# Patient Record
Sex: Female | Born: 1955 | Race: Black or African American | Hispanic: No | Marital: Married | Smoking: Never smoker
Health system: Southern US, Community
[De-identification: ages and names within clinical notes are randomized; demographics above are authoritative.]

## PROBLEM LIST (undated history)

## (undated) DIAGNOSIS — R053 Chronic cough: Secondary | ICD-10-CM

## (undated) DIAGNOSIS — I219 Acute myocardial infarction, unspecified: Secondary | ICD-10-CM

## (undated) DIAGNOSIS — E785 Hyperlipidemia, unspecified: Secondary | ICD-10-CM

## (undated) DIAGNOSIS — R05 Cough: Secondary | ICD-10-CM

## (undated) DIAGNOSIS — K219 Gastro-esophageal reflux disease without esophagitis: Secondary | ICD-10-CM

## (undated) HISTORY — DX: Cough: R05

## (undated) HISTORY — PX: TUBAL LIGATION: SHX77

## (undated) HISTORY — DX: Acute myocardial infarction, unspecified: I21.9

## (undated) HISTORY — DX: Hyperlipidemia, unspecified: E78.5

## (undated) HISTORY — PX: ABDOMINAL HYSTERECTOMY: SHX81

## (undated) HISTORY — DX: Chronic cough: R05.3

## (undated) HISTORY — DX: Gastro-esophageal reflux disease without esophagitis: K21.9

---

## 1982-08-15 HISTORY — PX: BREAST SURGERY: SHX581

## 2003-05-28 ENCOUNTER — Encounter: Payer: Self-pay | Admitting: Emergency Medicine

## 2003-05-28 ENCOUNTER — Emergency Department (HOSPITAL_COMMUNITY): Admission: EM | Admit: 2003-05-28 | Discharge: 2003-05-28 | Payer: Self-pay | Admitting: Emergency Medicine

## 2004-09-30 ENCOUNTER — Ambulatory Visit: Payer: Self-pay | Admitting: Family Medicine

## 2004-11-05 ENCOUNTER — Ambulatory Visit: Payer: Self-pay | Admitting: Family Medicine

## 2004-11-05 ENCOUNTER — Other Ambulatory Visit: Admission: RE | Admit: 2004-11-05 | Discharge: 2004-11-05 | Payer: Self-pay | Admitting: Family Medicine

## 2004-11-10 ENCOUNTER — Encounter: Admission: RE | Admit: 2004-11-10 | Discharge: 2004-11-10 | Payer: Self-pay | Admitting: Family Medicine

## 2004-11-19 ENCOUNTER — Ambulatory Visit: Payer: Self-pay

## 2005-04-05 ENCOUNTER — Ambulatory Visit: Payer: Self-pay | Admitting: Family Medicine

## 2005-05-19 ENCOUNTER — Ambulatory Visit: Payer: Self-pay | Admitting: Family Medicine

## 2005-06-03 ENCOUNTER — Ambulatory Visit: Payer: Self-pay | Admitting: Internal Medicine

## 2005-06-07 ENCOUNTER — Ambulatory Visit: Payer: Self-pay | Admitting: Family Medicine

## 2006-02-10 ENCOUNTER — Ambulatory Visit: Payer: Self-pay | Admitting: Family Medicine

## 2006-02-24 ENCOUNTER — Ambulatory Visit (HOSPITAL_COMMUNITY): Admission: RE | Admit: 2006-02-24 | Discharge: 2006-02-24 | Payer: Self-pay | Admitting: Family Medicine

## 2006-03-07 ENCOUNTER — Encounter: Admission: RE | Admit: 2006-03-07 | Discharge: 2006-03-07 | Payer: Self-pay | Admitting: Family Medicine

## 2006-06-05 ENCOUNTER — Ambulatory Visit: Payer: Self-pay | Admitting: Family Medicine

## 2006-10-09 ENCOUNTER — Ambulatory Visit: Payer: Self-pay | Admitting: Family Medicine

## 2006-10-27 ENCOUNTER — Encounter: Admission: RE | Admit: 2006-10-27 | Discharge: 2006-10-27 | Payer: Self-pay | Admitting: Internal Medicine

## 2006-10-27 ENCOUNTER — Ambulatory Visit: Payer: Self-pay | Admitting: Internal Medicine

## 2007-07-05 ENCOUNTER — Ambulatory Visit: Payer: Self-pay | Admitting: Family Medicine

## 2007-11-01 ENCOUNTER — Ambulatory Visit: Payer: Self-pay | Admitting: Family Medicine

## 2007-11-01 DIAGNOSIS — R252 Cramp and spasm: Secondary | ICD-10-CM

## 2007-11-01 DIAGNOSIS — M25559 Pain in unspecified hip: Secondary | ICD-10-CM | POA: Insufficient documentation

## 2007-11-01 DIAGNOSIS — E785 Hyperlipidemia, unspecified: Secondary | ICD-10-CM | POA: Insufficient documentation

## 2007-11-01 DIAGNOSIS — K219 Gastro-esophageal reflux disease without esophagitis: Secondary | ICD-10-CM

## 2007-12-11 ENCOUNTER — Telehealth (INDEPENDENT_AMBULATORY_CARE_PROVIDER_SITE_OTHER): Payer: Self-pay | Admitting: *Deleted

## 2008-02-22 ENCOUNTER — Ambulatory Visit: Payer: Self-pay | Admitting: Family Medicine

## 2008-02-22 ENCOUNTER — Encounter (INDEPENDENT_AMBULATORY_CARE_PROVIDER_SITE_OTHER): Payer: Self-pay | Admitting: *Deleted

## 2008-02-22 DIAGNOSIS — I252 Old myocardial infarction: Secondary | ICD-10-CM

## 2008-02-22 DIAGNOSIS — D239 Other benign neoplasm of skin, unspecified: Secondary | ICD-10-CM | POA: Insufficient documentation

## 2008-02-22 DIAGNOSIS — E8941 Symptomatic postprocedural ovarian failure: Secondary | ICD-10-CM

## 2008-02-25 ENCOUNTER — Telehealth (INDEPENDENT_AMBULATORY_CARE_PROVIDER_SITE_OTHER): Payer: Self-pay | Admitting: *Deleted

## 2008-03-06 ENCOUNTER — Encounter: Admission: RE | Admit: 2008-03-06 | Discharge: 2008-03-06 | Payer: Self-pay | Admitting: Family Medicine

## 2008-03-06 ENCOUNTER — Encounter: Payer: Self-pay | Admitting: Family Medicine

## 2008-03-06 ENCOUNTER — Encounter (INDEPENDENT_AMBULATORY_CARE_PROVIDER_SITE_OTHER): Payer: Self-pay | Admitting: *Deleted

## 2008-03-07 ENCOUNTER — Encounter: Payer: Self-pay | Admitting: Family Medicine

## 2008-03-07 ENCOUNTER — Ambulatory Visit: Payer: Self-pay | Admitting: Gastroenterology

## 2008-03-07 ENCOUNTER — Ambulatory Visit: Payer: Self-pay

## 2008-03-10 ENCOUNTER — Encounter (INDEPENDENT_AMBULATORY_CARE_PROVIDER_SITE_OTHER): Payer: Self-pay | Admitting: *Deleted

## 2008-03-14 ENCOUNTER — Encounter: Payer: Self-pay | Admitting: Gastroenterology

## 2008-03-14 ENCOUNTER — Ambulatory Visit: Payer: Self-pay | Admitting: Gastroenterology

## 2008-03-17 ENCOUNTER — Encounter (INDEPENDENT_AMBULATORY_CARE_PROVIDER_SITE_OTHER): Payer: Self-pay | Admitting: *Deleted

## 2008-03-17 ENCOUNTER — Encounter: Payer: Self-pay | Admitting: Gastroenterology

## 2008-05-20 ENCOUNTER — Encounter: Admission: RE | Admit: 2008-05-20 | Discharge: 2008-05-20 | Payer: Self-pay | Admitting: Family Medicine

## 2008-05-22 ENCOUNTER — Ambulatory Visit: Payer: Self-pay | Admitting: Family Medicine

## 2008-05-28 ENCOUNTER — Encounter: Payer: Self-pay | Admitting: Family Medicine

## 2008-05-30 ENCOUNTER — Telehealth (INDEPENDENT_AMBULATORY_CARE_PROVIDER_SITE_OTHER): Payer: Self-pay | Admitting: *Deleted

## 2008-06-26 ENCOUNTER — Encounter (INDEPENDENT_AMBULATORY_CARE_PROVIDER_SITE_OTHER): Payer: Self-pay | Admitting: Diagnostic Radiology

## 2008-06-26 ENCOUNTER — Encounter: Admission: RE | Admit: 2008-06-26 | Discharge: 2008-06-26 | Payer: Self-pay | Admitting: Family Medicine

## 2008-06-26 ENCOUNTER — Encounter: Payer: Self-pay | Admitting: Family Medicine

## 2009-07-07 ENCOUNTER — Ambulatory Visit: Payer: Self-pay | Admitting: Family Medicine

## 2009-07-07 DIAGNOSIS — R0989 Other specified symptoms and signs involving the circulatory and respiratory systems: Secondary | ICD-10-CM

## 2009-07-07 DIAGNOSIS — R42 Dizziness and giddiness: Secondary | ICD-10-CM

## 2009-07-08 ENCOUNTER — Ambulatory Visit: Payer: Self-pay | Admitting: Family Medicine

## 2009-07-11 LAB — CONVERTED CEMR LAB
Basophils Relative: 0.9 % (ref 0.0–3.0)
Bilirubin, Direct: 0.1 mg/dL (ref 0.0–0.3)
CO2: 30 meq/L (ref 19–32)
Chloride: 106 meq/L (ref 96–112)
Eosinophils Absolute: 0.1 10*3/uL (ref 0.0–0.7)
Eosinophils Relative: 1.1 % (ref 0.0–5.0)
HCT: 35.3 % — ABNORMAL LOW (ref 36.0–46.0)
Lymphocytes Relative: 36 % (ref 12.0–46.0)
Microalb Creat Ratio: 3.8 mg/g (ref 0.0–30.0)
Monocytes Absolute: 0.3 10*3/uL (ref 0.1–1.0)
Monocytes Relative: 7.1 % (ref 3.0–12.0)
Potassium: 3.8 meq/L (ref 3.5–5.1)
RBC: 3.89 M/uL (ref 3.87–5.11)
RDW: 13.7 % (ref 11.5–14.6)
Sodium: 142 meq/L (ref 135–145)
Total Bilirubin: 0.8 mg/dL (ref 0.3–1.2)
Total Protein: 7.5 g/dL (ref 6.0–8.3)
Triglycerides: 82 mg/dL (ref 0.0–149.0)
WBC: 4.6 10*3/uL (ref 4.5–10.5)

## 2009-07-15 ENCOUNTER — Telehealth (INDEPENDENT_AMBULATORY_CARE_PROVIDER_SITE_OTHER): Payer: Self-pay | Admitting: *Deleted

## 2009-07-20 ENCOUNTER — Telehealth (INDEPENDENT_AMBULATORY_CARE_PROVIDER_SITE_OTHER): Payer: Self-pay | Admitting: *Deleted

## 2009-07-23 ENCOUNTER — Encounter: Payer: Self-pay | Admitting: Family Medicine

## 2009-07-24 ENCOUNTER — Ambulatory Visit: Payer: Self-pay

## 2009-07-24 ENCOUNTER — Encounter: Payer: Self-pay | Admitting: Family Medicine

## 2009-07-27 ENCOUNTER — Encounter (INDEPENDENT_AMBULATORY_CARE_PROVIDER_SITE_OTHER): Payer: Self-pay | Admitting: *Deleted

## 2009-07-28 ENCOUNTER — Telehealth: Payer: Self-pay | Admitting: Family Medicine

## 2009-07-31 ENCOUNTER — Ambulatory Visit: Payer: Self-pay | Admitting: Family Medicine

## 2009-07-31 DIAGNOSIS — M25519 Pain in unspecified shoulder: Secondary | ICD-10-CM

## 2009-07-31 DIAGNOSIS — M79609 Pain in unspecified limb: Secondary | ICD-10-CM

## 2009-08-03 ENCOUNTER — Ambulatory Visit: Payer: Self-pay | Admitting: Family Medicine

## 2009-08-04 ENCOUNTER — Telehealth (INDEPENDENT_AMBULATORY_CARE_PROVIDER_SITE_OTHER): Payer: Self-pay | Admitting: *Deleted

## 2010-03-22 ENCOUNTER — Telehealth: Payer: Self-pay | Admitting: Family Medicine

## 2010-03-23 ENCOUNTER — Ambulatory Visit: Payer: Self-pay | Admitting: Diagnostic Radiology

## 2010-03-23 ENCOUNTER — Ambulatory Visit: Payer: Self-pay | Admitting: Family Medicine

## 2010-03-23 ENCOUNTER — Ambulatory Visit (HOSPITAL_BASED_OUTPATIENT_CLINIC_OR_DEPARTMENT_OTHER): Admission: RE | Admit: 2010-03-23 | Discharge: 2010-03-23 | Payer: Self-pay | Admitting: Family Medicine

## 2010-03-23 DIAGNOSIS — IMO0002 Reserved for concepts with insufficient information to code with codable children: Secondary | ICD-10-CM

## 2010-03-23 DIAGNOSIS — M549 Dorsalgia, unspecified: Secondary | ICD-10-CM | POA: Insufficient documentation

## 2010-05-19 ENCOUNTER — Emergency Department (HOSPITAL_BASED_OUTPATIENT_CLINIC_OR_DEPARTMENT_OTHER)
Admission: EM | Admit: 2010-05-19 | Discharge: 2010-05-19 | Payer: Self-pay | Source: Home / Self Care | Admitting: Emergency Medicine

## 2010-05-19 ENCOUNTER — Encounter: Payer: Self-pay | Admitting: Family Medicine

## 2010-05-19 ENCOUNTER — Telehealth: Payer: Self-pay | Admitting: Family Medicine

## 2010-05-19 ENCOUNTER — Ambulatory Visit: Payer: Self-pay | Admitting: Diagnostic Radiology

## 2010-05-23 ENCOUNTER — Emergency Department (HOSPITAL_BASED_OUTPATIENT_CLINIC_OR_DEPARTMENT_OTHER): Admission: EM | Admit: 2010-05-23 | Discharge: 2010-05-23 | Payer: Self-pay | Admitting: Emergency Medicine

## 2010-05-27 ENCOUNTER — Ambulatory Visit: Payer: Self-pay | Admitting: Family Medicine

## 2010-05-31 ENCOUNTER — Encounter (INDEPENDENT_AMBULATORY_CARE_PROVIDER_SITE_OTHER): Payer: Self-pay | Admitting: *Deleted

## 2010-06-01 ENCOUNTER — Telehealth: Payer: Self-pay | Admitting: Family Medicine

## 2010-06-01 ENCOUNTER — Encounter: Payer: Self-pay | Admitting: Family Medicine

## 2010-06-07 ENCOUNTER — Encounter: Payer: Self-pay | Admitting: Family Medicine

## 2010-06-07 ENCOUNTER — Telehealth: Payer: Self-pay | Admitting: Family Medicine

## 2010-07-01 ENCOUNTER — Encounter: Payer: Self-pay | Admitting: Family Medicine

## 2010-07-06 ENCOUNTER — Encounter: Payer: Self-pay | Admitting: Family Medicine

## 2010-07-06 ENCOUNTER — Ambulatory Visit: Payer: Self-pay | Admitting: Family Medicine

## 2010-07-06 LAB — CONVERTED CEMR LAB
Bilirubin Urine: NEGATIVE
Glucose, Urine, Semiquant: NEGATIVE
Ketones, urine, test strip: NEGATIVE
Urobilinogen, UA: NEGATIVE
pH: 7

## 2010-07-07 ENCOUNTER — Encounter: Payer: Self-pay | Admitting: Family Medicine

## 2010-07-12 ENCOUNTER — Telehealth (INDEPENDENT_AMBULATORY_CARE_PROVIDER_SITE_OTHER): Payer: Self-pay | Admitting: *Deleted

## 2010-07-12 LAB — CONVERTED CEMR LAB
Albumin: 3.7 g/dL (ref 3.5–5.2)
Basophils Absolute: 0 10*3/uL (ref 0.0–0.1)
Basophils Relative: 0.6 % (ref 0.0–3.0)
CO2: 26 meq/L (ref 19–32)
Chloride: 107 meq/L (ref 96–112)
Cholesterol: 211 mg/dL — ABNORMAL HIGH (ref 0–200)
Creatinine, Ser: 0.9 mg/dL (ref 0.4–1.2)
GFR calc non Af Amer: 83.42 mL/min (ref >60)
HDL: 59.5 mg/dL (ref >39.00)
Hemoglobin: 11.1 g/dL — ABNORMAL LOW (ref 12.0–15.0)
Lymphs Abs: 1.5 10*3/uL (ref 0.7–4.0)
MCV: 89.7 fL (ref 78.0–100.0)
Monocytes Relative: 6.2 % (ref 3.0–12.0)
Neutro Abs: 3.1 10*3/uL (ref 1.4–7.7)
Platelets: 393 10*3/uL (ref 150.0–400.0)
RBC: 3.71 M/uL — ABNORMAL LOW (ref 3.87–5.11)
RDW: 15.6 % — ABNORMAL HIGH (ref 11.5–14.6)
Sodium: 145 meq/L (ref 135–145)
TSH: 0.42 microintl units/mL (ref 0.35–5.50)
Triglycerides: 53 mg/dL (ref 0.0–149.0)
VLDL: 10.6 mg/dL (ref 0.0–40.0)

## 2010-07-15 ENCOUNTER — Encounter
Admission: RE | Admit: 2010-07-15 | Discharge: 2010-08-10 | Payer: Self-pay | Source: Home / Self Care | Attending: Physical Medicine and Rehabilitation | Admitting: Physical Medicine and Rehabilitation

## 2010-07-22 ENCOUNTER — Emergency Department (HOSPITAL_BASED_OUTPATIENT_CLINIC_OR_DEPARTMENT_OTHER): Admission: EM | Admit: 2010-07-22 | Discharge: 2010-06-04 | Payer: Self-pay | Admitting: Emergency Medicine

## 2010-09-01 ENCOUNTER — Ambulatory Visit
Admission: RE | Admit: 2010-09-01 | Discharge: 2010-09-01 | Payer: Self-pay | Source: Home / Self Care | Attending: Family Medicine | Admitting: Family Medicine

## 2010-09-01 DIAGNOSIS — J019 Acute sinusitis, unspecified: Secondary | ICD-10-CM | POA: Insufficient documentation

## 2010-09-04 ENCOUNTER — Encounter: Payer: Self-pay | Admitting: Family Medicine

## 2010-09-05 ENCOUNTER — Encounter: Payer: Self-pay | Admitting: Family Medicine

## 2010-09-10 ENCOUNTER — Encounter
Admission: RE | Admit: 2010-09-10 | Discharge: 2010-09-10 | Payer: Self-pay | Source: Home / Self Care | Attending: Family Medicine | Admitting: Family Medicine

## 2010-09-12 LAB — CONVERTED CEMR LAB
ALT: 13 units/L (ref 0–35)
Albumin: 3.9 g/dL (ref 3.5–5.2)
Bilirubin Urine: NEGATIVE
Bilirubin, Direct: 0.1 mg/dL (ref 0.0–0.3)
Blood in Urine, dipstick: NEGATIVE
CO2: 32 meq/L (ref 19–32)
Calcium: 9.7 mg/dL (ref 8.4–10.5)
Cholesterol: 196 mg/dL (ref 0–200)
Glucose, Urine, Semiquant: NEGATIVE
Glucose, Urine, Semiquant: NEGATIVE
HCT: 35.7 % — ABNORMAL LOW (ref 36.0–46.0)
HDL: 56.5 mg/dL (ref >39.0)
Hemoglobin: 10.8 g/dL
Hemoglobin: 12.1 g/dL (ref 12.0–15.0)
Ketones, urine, test strip: NEGATIVE
LDL Cholesterol: 125 mg/dL — ABNORMAL HIGH (ref 0–99)
Monocytes Absolute: 0.3 10*3/uL (ref 0.1–1.0)
Neutrophils Relative %: 55.8 % (ref 43.0–77.0)
Nitrite: NEGATIVE
Platelets: 378 10*3/uL (ref 150–400)
Protein, U semiquant: NEGATIVE
Protein, U semiquant: NEGATIVE
Specific Gravity, Urine: 1.015
Specific Gravity, Urine: 1.015
TSH: 0.57 microintl units/mL (ref 0.35–5.50)
VLDL: 14 mg/dL (ref 0–40)
WBC Urine, dipstick: NEGATIVE
pH: 5
pH: 6.5

## 2010-09-14 NOTE — Progress Notes (Signed)
Summary: Pt wants and extended note  Phone Note Call from Patient   Caller: Patient Call For: Loreen Freud DO Details for Reason: Still home Summary of Call: Call from pt says she still in bed and having pain, sees Ortho next wednesday Nov 2nd. Wants to get another letter extending through next Friday Nov 5th. We wrote her back in to work on the 21st.  Please advise. c/b [id number]..... Initial call taken by: Almeta Monas CMA Duncan Dull),  June 07, 2010 1:12 PM  Follow-up for Phone Call        WE CAN WRITE IT UNTIL NEXT wEDNESDAY---ORTHO WILL EXTEND IT PAST THAT IF THEY WANT HER TO STAY OUT Follow-up by: Loreen Freud DO,  June 07, 2010 2:31 PM  Additional Follow-up for Phone Call Additional follow up Details #1::        pt notified and voiced understanading.... Almeta Monas CMA Duncan Dull)  June 07, 2010 3:20 PM

## 2010-09-14 NOTE — Letter (Signed)
Summary: Out of Work  [facility]  [address] [place], [place] [address]   Phone: [phone]  Fax: [phone]       June 07, 2010   Employee:  W. MAE Boesen    To Whom It May Concern:   For Medical reasons, please excuse the above named employee from work for the following dates:  Start:   06/07/10  End:   06/16/10  If you need additional information, please feel free to contact our office.         Sincerely,        Loreen Freud DO

## 2010-09-14 NOTE — Progress Notes (Signed)
Summary: Casey Carrillo pt to ED  Phone Note Call from Patient   Caller: Patient Summary of Call: Pt called c/o severe sharp pain in right side. Pt states that it is very tender to touch but unable to confirm if there is any swelling or redness due to unable to see area. Pt advise ED, pt ok .................Marland KitchenFelecia Deloach CMA  May 19, 2010 11:01 AM

## 2010-09-14 NOTE — Progress Notes (Signed)
Summary: Back Spasms- fyi  Phone Note Call from Patient Call back at [phone]   Caller: Patient Call For: Loreen Freud DO Summary of Call: Patient is having back spasms and wants to talk to the nurse about it. Initial call taken by: Barnie Mort,  March 22, 2010 3:09 PM  Follow-up for Phone Call        Pt states that she has been in bed since friday. Hard for her stand up. She has an appt tomorrow am to see Doctor. Army Fossa CMA  March 22, 2010 4:49 PM

## 2010-09-14 NOTE — Assessment & Plan Note (Signed)
Summary: CPX//PH   Vital Signs:  Patient profile:   55 year old female Menstrual status:  hysterectomy Height:      63 inches Weight:      189.8 pounds Temp:     97.1 degrees F oral Pulse rate:   80 / minute Pulse rhythm:   regular BP sitting:   126 / 80  (right arm) Cuff size:   large  Vitals Entered By: Almeta Monas CMA Duncan Dull) (July 06, 2010 9:13 AM) CC: CPX/Fasting     Menstrual Status hysterectomy   History of Present Illness: Pt here for cpe and labs.   No pap--- s/p TAH.   Pt is still c/o back pain and spasms-- Pt seeing Dr Ethelene Hal and was referred to PT.    Preventive Screening-Counseling & Management  Alcohol-Tobacco     Alcohol drinks/day: 0     Smoking Status: never     Passive Smoke Exposure: no  Caffeine-Diet-Exercise     Caffeine use/day: 1     Does Patient Exercise: no     Exercise Counseling: to improve exercise regimen  Hep-HIV-STD-Contraception     Dental Visit-last 6 months yes     Dental Care Counseling: not indicated; dental care within six months     SBE monthly: yes     SBE Education/Counseling: not indicated; SBE done regularly      Sexual History:  currently monogamous.    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 Take One Tablet Daily 2)  Freestyle Test  Strp (Glucose Blood) .... Use As Directed 3)  Freestyle Lite Test  Strp (Glucose Blood) .... Use As Directed. 4)  Prednisone (Pak) 10 Mg Tabs (Prednisone) .... As Directed 5)  Naprosyn 500 Mg Tabs (Naproxen) .Marland Kitchen.. 1 By Mouth Two Times A Day As Needed 6)  Vicodin Es 7.5-750 Mg Tabs (Hydrocodone-Acetaminophen) .Marland Kitchen.. 1 By Mouth Every 6 Hours As Needed  Current Medications (verified): 1)  Nexium 40 Mg  Cpdr (Esomeprazole Magnesium) .... Take One Tablet Daily 2)  Freestyle Test  Strp (Glucose Blood) .... Use As Directed 3)  Freestyle Lite Test  Strp (Glucose Blood) .... Use As Directed. 4)  Naprosyn 500 Mg Tabs (Naproxen) .Marland Kitchen.. 1 By Mouth Two Times A Day As Needed 5)  Vicodin Es 7.5-750 Mg Tabs (Hydrocodone-Acetaminophen) .Marland Kitchen.. 1 By Mouth Every 6 Hours As Needed 6)  Centrum Ultra Womens  Tabs (Multiple Vitamins-Minerals) .Marland Kitchen.. 1 By Mouth Once Daily 7)  Ibuprofen 800 Mg Tabs (Ibuprofen) .... By Mouth As Needed 8)  Aleve 220 Mg Tabs (Naproxen Sodium) .... By Mouth As Needed  Allergies (verified): No Known Drug Allergies  Past History:  Past Medical History: Last updated: 02/22/2008 GERD Hyperlipidemia Myocardial infarction, hx of-- date unknown--informed in 2004 while in hosp for Headache Myocardial infarction, hx of  Past Surgical History: Last updated: 11/01/2007 Hysterectomy-partial Tubal ligation Breast Bx-- L breast--1984  Family History: Last updated: 02/22/2008 Family History of CAD Female 1st degree relative <60-- sister 30 yo Family History of CAD Female 1st degree relative <50 Family History Diabetes 1st degree relative Family History Hypertension-  M 55yo ---kidney ca and heart dz,  HTN  Social History: Last updated: 07/06/2010 Divorced Never Smoked Alcohol use-no Drug use-no Regular exercise-yes 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 Family History of CAD Female 1st degree relative <60-- sister 34 yo Family History of CAD Female 1st degree relative <50 Family History Diabetes 1st degree relative Family History Hypertension-  M 55yo ---kidney ca and heart dz,  HTN  Social History: Reviewed history from 11/01/2007 and no changes required. Divorced Never Smoked Alcohol use-no Drug use-no Regular exercise-yes Occupation: Dept of Sales executive Does Patient Exercise:  no Occupation:  employed Conservation officer, historic buildings w/in 6 mos.:  yes Sexual History:  currently monogamous  Review of Systems      See HPI General:  Denies chills, fatigue, fever, loss of appetite, malaise, sleep disorder, sweats, weakness, and weight loss. Eyes:  Denies blurring, discharge, double vision, eye irritation, eye pain, halos, itching, light sensitivity, red eye, vision loss-1 eye, and vision loss-both eyes. ENT:  Denies decreased hearing, difficulty swallowing, ear discharge, earache, hoarseness, nasal congestion, nosebleeds, postnasal drainage, ringing in ears, sinus pressure, and sore throat; optho q 2y  . CV:  Denies bluish discoloration of lips or nails, chest pain or discomfort, difficulty breathing at night, difficulty breathing while lying down, fainting, fatigue, leg cramps with exertion, lightheadness, near fainting, palpitations, shortness of breath with exertion, swelling of feet, swelling of hands, and weight gain. Resp:  Denies chest discomfort, chest pain with inspiration, cough, coughing up blood, excessive snoring, hypersomnolence, morning headaches, pleuritic, shortness of breath, sputum productive, and wheezing. GI:  Denies abdominal  pain, bloody stools, change in bowel habits, constipation, dark tarry stools, diarrhea, excessive appetite, gas, hemorrhoids, indigestion, loss of appetite, nausea, vomiting, vomiting blood, and yellowish skin color. GU:  Denies abnormal vaginal bleeding, decreased libido, discharge, dysuria, genital sores, hematuria, incontinence, nocturia, urinary frequency, and urinary hesitancy. MS:  Complains of low back pain; denies joint pain, joint redness, joint swelling, loss of strength, mid back pain, muscle aches, muscle , cramps, muscle weakness, stiffness, and thoracic pain. Derm:  Denies changes in color of skin, changes in nail beds, dryness, excessive perspiration, flushing, hair loss, insect bite(s), itching, lesion(s), poor wound healing, and rash. Neuro:  Denies brief paralysis, difficulty with concentration, disturbances in coordination, falling down, headaches, inability to speak, memory loss, numbness, poor balance, seizures, sensation of room spinning, tingling, tremors, visual disturbances, and weakness. Psych:  Denies alternate hallucination ( auditory/visual), anxiety, depression, easily angered, easily tearful, irritability, mental problems, panic attacks, sense of great danger, suicidal thoughts/plans, thoughts of violence, unusual visions or sounds, and thoughts /plans of harming others. Endo:  Denies cold intolerance, excessive hunger, excessive thirst, excessive urination, heat intolerance, polyuria, and weight change. Heme:  Denies abnormal bruising, bleeding, enlarge lymph nodes, fevers, pallor, and skin discoloration. Allergy:  Denies hives or rash, itching  eyes, persistent infections, seasonal allergies, and sneezing.  Physical Exam  General:  Well-developed,well-nourished,in no acute distress; alert,appropriate and cooperative throughout examination Head:  Normocephalic and atraumatic without obvious abnormalities. No apparent alopecia or balding. Eyes:  vision grossly intact,  pupils equal, pupils round, pupils reactive to light, and no injection.   Ears:  External ear exam shows no significant lesions or deformities.  Otoscopic examination reveals clear canals, tympanic membranes are intact bilaterally without bulging, retraction, inflammation or discharge. Hearing is grossly normal bilaterally. Nose:  External nasal examination shows no deformity or inflammation. Nasal mucosa are pink and moist without lesions or exudates. Mouth:  Oral mucosa and oropharynx without lesions or exudates.  Teeth in good repair. Neck:  No deformities, masses, or tenderness noted. Chest Wall:  No deformities, masses, or tenderness noted. Breasts:  No mass, nodules, thickening, tenderness, bulging, retraction, inflamation, nipple discharge or skin changes noted.   Lungs:  Normal respiratory effort, chest expands symmetrically. Lungs are clear to auscultation, no crackles or wheezes. Heart:  normal rate and no murmur.   Abdomen:  Bowel sounds positive,abdomen soft and non-tender without masses, organomegaly or hernias noted. Msk:  normal ROM, no joint tenderness, no joint swelling, no joint warmth, no redness over joints, no joint deformities, no joint instability, and no crepitation.   Pulses:  R posterior tibial normal, R dorsalis pedis normal, R carotid normal, L posterior tibial normal, L dorsalis pedis normal, and L carotid normal.   Extremities:  No clubbing, cyanosis, edema, or deformity noted with normal full range of motion of all joints.   Neurologic:  No cranial nerve deficits noted. Station and gait are normal. Plantar reflexes are down-going bilaterally. DTRs are symmetrical throughout. Sensory, motor and coordinative functions appear intact. Skin:  Intact without suspicious lesions or rashes Cervical Nodes:  No lymphadenopathy noted Axillary Nodes:  No palpable lymphadenopathy Psych:  Cognition and judgment appear intact. Alert and cooperative with normal attention span and  concentration. No apparent delusions, illusions, hallucinations   Impression & Recommendations:  Problem # 1:  PREVENTIVE HEALTH CARE (ICD-V70.0)  Orders: Venipuncture ([address]) TLB-Lipid Panel (80061-LIPID) TLB-BMP (Basic Metabolic Panel-BMET) (80048-METABOL) TLB-CBC Platelet - w/Differential (85025-CBCD) TLB-Hepatic/Liver Function Pnl (80076-HEPATIC) TLB-TSH (Thyroid Stimulating Hormone) (84443-TSH) Specimen Handling (60454) EKG w/ Interpretation (93000)  Problem # 2:  MYOCARDIAL INFARCTION, HX OF (ICD-412)  Orders: Venipuncture ([address]11) TLB-Lipid Panel (80061-LIPID) TLB-BMP (Basic Metabolic Panel-BMET) (80048-METABOL) TLB-CBC Platelet - w/Differential (85025-CBCD) TLB-Hepatic/Liver Function Pnl (80076-HEPATIC) TLB-TSH (Thyroid Stimulating Hormone) (84443-TSH) Specimen Handling (91478) EKG w/ Interpretation (93000)  Labs Reviewed: Chol: 201 (07/08/2009)   HDL: 59.60 (07/08/2009)   LDL: 125 (02/22/2008)   TG: 82.0 (07/08/2009)  Problem # 3:  HYPERLIPIDEMIA (ICD-272.4)  Orders: Venipuncture (29562) TLB-Lipid Panel (80061-LIPID) TLB-BMP (Basic Metabolic Panel-BMET) (80048-METABOL) TLB-CBC Platelet - w/Differential (85025-CBCD) TLB-Hepatic/Liver Function Pnl (80076-HEPATIC) TLB-TSH (Thyroid Stimulating Hormone) (84443-TSH) Specimen Handling (13086) EKG w/ Interpretation (93000)  Labs Reviewed: SGOT: 15 (07/08/2009)   SGPT: 12 (07/08/2009)   HDL:59.60 (07/08/2009), 56.5 (02/22/2008)  LDL:125 (02/22/2008)  Chol:201 (07/08/2009), 196 (02/22/2008)  Trig:82.0 (07/08/2009), 71 (02/22/2008)  Problem # 4:  CAROTID BRUIT, RIGHT (ICD-785.9)  Orders: Venipuncture (57846) TLB-Lipid Panel (80061-LIPID) TLB-BMP (Basic Metabolic Panel-BMET) (80048-METABOL) TLB-CBC Platelet - w/Differential (85025-CBCD) TLB-Hepatic/Liver Function Pnl (80076-HEPATIC) TLB-TSH (Thyroid Stimulating Hormone) (84443-TSH) Specimen Handling (96295)  Complete Medication List: 1)  Nexium 40 Mg Cpdr  (Esomeprazole magnesium) .... Take one tablet daily 2)  Freestyle Test Strp (Glucose blood) .... Use as directed 3)  Freestyle Lite Test Strp (Glucose blood) .... Use  as directed. 4)  Centrum Ultra Womens Tabs (Multiple vitamins-minerals) .Marland Kitchen.. 1 by mouth once daily 5)  Ibuprofen 800 Mg Tabs (Ibuprofen) .... By mouth as needed 6)  Aleve 220 Mg Tabs (Naproxen sodium) .... By mouth as needed  Other Orders: Tdap => 11yrs IM (47829) Admin 1st Vaccine (56213) T-Culture, Urine (08657-84696) UA Dipstick W/ Micro (manual) (81000)   Orders Added: 1)  Venipuncture [36415] 2)  TLB-Lipid Panel [80061-LIPID] 3)  TLB-BMP (Basic Metabolic Panel-BMET) [80048-METABOL] 4)  TLB-CBC Platelet - w/Differential [85025-CBCD] 5)  TLB-Hepatic/Liver Function Pnl [80076-HEPATIC] 6)  TLB-TSH (Thyroid Stimulating Hormone) [84443-TSH] 7)  Specimen Handling [99000] 8)  Tdap => 24yrs IM [90715] 9)  Admin 1st Vaccine [90471] 10)  T-Culture, Urine [29528-41324] 11)  UA Dipstick W/ Micro (manual) [81000] 12)  Est. Patient 40-64 years [99396] 13)  EKG w/ Interpretation [93000]   Immunizations Administered:  Tetanus Vaccine:    Vaccine Type: Tdap    Site: left deltoid    Mfr: Merck    Dose: 0.5 ml    Route: IM    Given by: Almeta Monas CMA (AAMA)    Exp. Date: 06/03/2012    Lot #: [id number]    VIS given: 07/02/08 version given July 06, 2010.   Immunizations Administered:  Tetanus Vaccine:    Vaccine Type: Tdap    Site: left deltoid    Mfr: Merck    Dose: 0.5 ml    Route: IM    Given by: Almeta Monas CMA (AAMA)    Exp. Date: 06/03/2012    Lot #: [id number]    VIS given: 07/02/08 version given July 06, 2010.    Laboratory Results   Urine Tests    Routine Urinalysis   Color: yellow Appearance: Hazy Glucose: negative   (Normal Range: Negative) Bilirubin: negative   (Normal Range: Negative) Ketone: negative   (Normal Range: Negative) Spec. Gravity: <1.005   (Normal Range:  1.003-1.035) Blood: trace-lysed   (Normal Range: Negative) pH: 7.0   (Normal Range: 5.0-8.0) Protein: negative   (Normal Range: Negative) Urobilinogen: negative   (Normal Range: 0-1) Nitrite: positive   (Normal Range: Negative) Leukocyte Esterace: trace   (Normal Range: Negative)    Comments: Floydene Flock  July 06, 2010 11:37 AM cx sent

## 2010-09-14 NOTE — Letter (Signed)
Summary: [facility]   Imported By: Lanelle Bal 07/12/2010 12:39:16  _____________________________________________________________________  External Attachment:    Type:   Image     Comment:   External Document

## 2010-09-14 NOTE — Letter (Signed)
Summary: Primary Care Consult Scheduled Letter  [facility]  [address] [place], [place] [address]   Phone: [phone]  Fax: [phone]      05/31/2010 MRN: [id number]  Vernee Baines [address] [place], [place]  [address]    Dear Ms. Lapinsky,    We have scheduled an appointment for you.  At the recommendation of Dr. Loreen Freud, we have scheduled you for a Screening Mammogram with [facility] on 06-07-2010 at 5:00pm.  Their address is [address], [address], [place] [address]. The office phone number is [phone].  If this appointment day and time is not convenient for you, please feel free to call the office of the doctor you are being referred to at the number listed above and reschedule the appointment.    It is important for you to keep your scheduled appointments. We are here to make sure you are given good patient care.   Thank you,    Renee, Patient Care Coordinator [facility]

## 2010-09-14 NOTE — Letter (Signed)
Summary: Out of Work  [facility]  [address] [place], [place] [address]   Phone: [phone]  Fax: [phone]      June 01, 2010   Employee:  W. MAE Baldini    To Whom It May Concern:   For Medical reasons, please excuse the above named employee from work for the following dates:  Start:   05/31/2010  End:   06/04/2010  If you need additional information, please feel free to contact our office.         Sincerely,        Loreen Freud DO

## 2010-09-14 NOTE — Assessment & Plan Note (Signed)
Summary: HOSPTIAL FOLLOWUP//KN   Vital Signs:  Patient profile:   55 year old female Height:      63 inches Weight:      193.8 pounds BMI:     34.45 Temp:     97.6 degrees F oral Pulse rate:   88 / minute Pulse rhythm:   regular BP sitting:   146 / 92  (left arm) Cuff size:   large  Vitals Entered By: Almeta Monas CMA Duncan Dull) (May 27, 2010 3:07 PM) CC: HOSPT F/U FOR BACK PAIN, Back Pain   History of Present Illness:       This is a 55 year old woman who presents with Back Pain.  see last visit and ER visits.  The patient denies fever, chills, weakness, loss of sensation, fecal incontinence, urinary incontinence, urinary retention, dysuria, rest pain, inability to work, and inability to care for self.  The pain is located in the mid thoracic back.  The pain began gradually.  The pain is made better by inactivity, NSAID medications, muscle relaxants, opioids, and heat.  Risk factors for serious underlying conditions include duration of pain > 1 month and bedrest with no relief.    Current Medications (verified): 1)  Nexium 40 Mg  Cpdr (Esomeprazole Magnesium) .... Take One Tablet Daily 2)  Freestyle Test  Strp (Glucose Blood) .... Use As Directed 3)  Freestyle Lite Test  Strp (Glucose Blood) .... Use As Directed. 4)  Prednisone (Pak) 10 Mg Tabs (Prednisone) .... As Directed 5)  Naprosyn 500 Mg Tabs (Naproxen) .Marland Kitchen.. 1 By Mouth Two Times A Day As Needed  Allergies (verified): No Known Drug Allergies  Past History:  Past medical, surgical, family and social histories (including risk factors) reviewed for relevance to current acute and chronic problems.  Past Medical History: Reviewed history from 02/22/2008 and no changes required. GERD Hyperlipidemia Myocardial infarction, hx of-- date unknown--informed in 2004 while in hosp for Headache Myocardial infarction, hx of  Past Surgical History: Reviewed history from 11/01/2007 and no changes  required. Hysterectomy-partial Tubal ligation Breast Bx-- L breast--1984  Family History: Reviewed history from 02/22/2008 and no changes required. Family History of CAD Female 1st degree relative <60-- sister 57 yo Family History of CAD Female 1st degree relative <50 Family History Diabetes 1st degree relative Family History Hypertension-  M 55yo ---kidney ca and heart dz,  HTN  Social History: Reviewed history from 11/01/2007 and no changes required. Divorced Never Smoked Alcohol use-no Drug use-no Regular exercise-yes  Review of Systems      See HPI  Physical Exam  General:  Well-developed,well-nourished,in no acute distress; alert,appropriate and cooperative throughout examination Abdomen:  Bowel sounds positive,abdomen soft and non-tender without masses, organomegaly or hernias noted. Extremities:  No clubbing, cyanosis, edema, or deformity noted with normal full range of motion of all joints.   Neurologic:  gait normal and DTRs symmetrical and normal.   Psych:  Cognition and judgment appear intact. Alert and cooperative with normal attention span and concentration. No apparent delusions, illusions, hallucinations   Impression & Recommendations:  Problem # 1:  Hx of BACK PAIN (ICD-724.5)  The following medications were removed from the medication list:    Flexeril 10 Mg Tabs (Cyclobenzaprine hcl) .Marland Kitchen... 1 by mouth three times a day as needed back pain    Vicodin Es 7.5-750 Mg Tabs (Hydrocodone-acetaminophen) .Marland Kitchen... 1 by mouth every 6 hours as needed Her updated medication list for this problem includes:    Naprosyn 500 Mg Tabs (Naproxen) .Marland KitchenMarland KitchenMarland KitchenMarland Kitchen  1 by mouth two times a day as needed  Orders: Orthopedic Surgeon Referral (Ortho Surgeon)  Discussed use of moist heat or ice, modified activities, medications, and stretching/strengthening exercises. Back care instructions given. To be seen in 2 weeks if no improvement; sooner if worsening of symptoms.   Complete Medication  List: 1)  Nexium 40 Mg Cpdr (Esomeprazole magnesium) .... Take one tablet daily 2)  Freestyle Test Strp (Glucose blood) .... Use as directed 3)  Freestyle Lite Test Strp (Glucose blood) .... Use as directed. 4)  Prednisone (pak) 10 Mg Tabs (Prednisone) .... As directed 5)  Naprosyn 500 Mg Tabs (Naproxen) .Marland Kitchen.. 1 by mouth two times a day as needed  Other Orders: Radiology Referral (Radiology) Admin 1st Vaccine (60630) Flu Vaccine 46yrs + [phone])  Patient Instructions: 1)  schedule cpe  Prescriptions: NAPROSYN 500 MG TABS (NAPROXEN) 1 by mouth two times a day as needed  #60 x 1   Entered and Authorized by:   Loreen Freud DO   Signed by:   Loreen Freud DO on 05/27/2010   Method used:   Electronically to        [facility]* (retail)       [address] [place], [place]  [address]       Ph: [phone]       Fax: [phone]   RxID:   [id number]   Flu Vaccine Consent Questions     Do you have a history of severe allergic reactions to this vaccine? no    Any prior history of allergic reactions to egg and/or gelatin? no    Do you have a sensitivity to the preservative Thimersol? no    Do you have a past history of Guillan-Barre Syndrome? no    Do you currently have an acute febrile illness? no    Have you ever had a severe reaction to latex? no    Vaccine information given and explained to patient? yes    Are you currently pregnant? no    Lot Number:[id number]   Exp Date:02/12/2011   Site Given  Left Deltoid IM .lbflu1

## 2010-09-14 NOTE — Assessment & Plan Note (Signed)
Summary: dizzy spells, elevated BP-jr   Vital Signs:  Patient profile:   55 year old female Height:      63 inches Weight:      196 pounds BMI:     34.85 Temp:     98.3 degrees F Pulse rate:   84 / minute Pulse rhythm:   regular BP sitting:   140 / 88  (left arm) Cuff size:   large  Vitals Entered By: Army Fossa CMA (July 07, 2009 3:48 PM) CC: Cough, elevated BP, dizzy ongoing x 3 weeks.  CBG Result 144   History of Present Illness:  Cough      This is a 55 year old woman who presents with Cough.  The symptoms began 3 weeks ago.  The patient reports non-productive cough and fever, but denies productive cough, pleuritic chest pain, shortness of breath, wheezing, exertional dyspnea, hemoptysis, and malaise.  The patient denies the following symptoms: cold/URI symptoms, sore throat, nasal congestion, chronic rhinitis, weight loss, acid reflux symptoms, and peripheral edema.  The cough is worse with activity.   Pt c/o dizziness for 3 weeks and bp has been elevated.  + chills  + body aches  Current Medications (verified): 1)  Nexium 40 Mg  Cpdr (Esomeprazole Magnesium) .... Take One Tablet Daily 2)  Antivert 25 Mg Tabs (Meclizine Hcl) .Marland Kitchen.. 1 By Mouth Qid As Needed  Allergies (verified): No Known Drug Allergies  Past History:  Past Medical History: Last updated: 02/22/2008 GERD Hyperlipidemia Myocardial infarction, hx of-- date unknown--informed in 2004 while in hosp for Headache Myocardial infarction, hx of  Past Surgical History: Last updated: 11/01/2007 Hysterectomy-partial Tubal ligation Breast Bx-- L breast--1984  Family History: Last updated: 02/22/2008 Family History of CAD Female 1st degree relative <60-- sister 44 yo Family History of CAD Female 1st degree relative <50 Family History Diabetes 1st degree relative Family History Hypertension-  M 55yo ---kidney ca and heart dz,  HTN  Social History: Last updated: 11/01/2007 Divorced Never  Smoked Alcohol use-no Drug use-no Regular exercise-yes  Risk Factors: Caffeine Use: 1 (02/22/2008) Exercise: yes (11/01/2007)  Risk Factors: Smoking Status: never (11/01/2007) Passive Smoke Exposure: no (02/22/2008)  Family History: Reviewed history from 02/22/2008 and no changes required. Family History of CAD Female 1st degree relative <60-- sister 52 yo Family History of CAD Female 1st degree relative <50 Family History Diabetes 1st degree relative Family History Hypertension-  M 55yo ---kidney ca and heart dz,  HTN  Social History: Reviewed history from 11/01/2007 and no changes required. Divorced Never Smoked Alcohol use-no Drug use-no Regular exercise-yes  Review of Systems      See HPI  Physical Exam  General:  Well-developed,well-nourished,in no acute distress; alert,appropriate and cooperative throughout examination Ears:  External ear exam shows no significant lesions or deformities.  Otoscopic examination reveals clear canals, tympanic membranes are intact bilaterally without bulging, retraction, inflammation or discharge. Hearing is grossly normal bilaterally. Nose:  External nasal examination shows no deformity or inflammation. Nasal mucosa are pink and moist without lesions or exudates. Mouth:  Oral mucosa and oropharynx without lesions or exudates.  Teeth in good repair. Neck:  R carotid bruit supple.   Lungs:  Normal respiratory effort, chest expands symmetrically. Lungs are clear to auscultation, no crackles or wheezes. Heart:  Normal rate and regular rhythm. S1 and S2 normal without gallop, murmur, click, rub or other extra sounds. Extremities:  No clubbing, cyanosis, edema, or deformity noted with normal full range of motion of all joints.  Skin:  Intact without suspicious lesions or rashes Cervical Nodes:  No lymphadenopathy noted Psych:  Cognition and judgment appear intact. Alert and cooperative with normal attention span and concentration. No  apparent delusions, illusions, hallucinations   Impression & Recommendations:  Problem # 1:  DIZZINESS (ICD-780.4)  Orders: Doppler Referral (Doppler) EKG w/ Interpretation (93[address])  Her updated medication list for this problem includes:    Antivert 25 Mg Tabs (Meclizine hcl) .Marland Kitchen... 1 by mouth qid as needed  Problem # 2:  CAROTID BRUIT, RIGHT (ICD-785.9)  Orders: Doppler Referral (Doppler) EKG w/ Interpretation (93000)  Problem # 3:  GERD (ICD-530.81)  Her updated medication list for this problem includes:    Nexium 40 Mg Cpdr (Esomeprazole magnesium) .Marland Kitchen... Take one tablet daily  Diagnostics Reviewed:  Discussed lifestyle modifications, diet, antacids/medications, and preventive measures. Handout provided.   Complete Medication List: 1)  Nexium 40 Mg Cpdr (Esomeprazole magnesium) .... Take one tablet daily 2)  Antivert 25 Mg Tabs (Meclizine hcl) .Marland Kitchen.. 1 by mouth qid as needed  Other Orders: Capillary Blood Glucose/CBG ([address]) Admin 1st Vaccine (60454) Flu Vaccine 56yrs + (09811)  Patient Instructions: 1)  285.Marland Kitchen9  790.6   bmp, cbcd, b12, ibc, ferritin, hgba1c, microalbumin, lipid, hep , tsh=----  fasting labs Prescriptions: NEXIUM 40 MG  CPDR (ESOMEPRAZOLE MAGNESIUM) TAKE ONE TABLET DAILY  #30 x 11   Entered and Authorized by:   Loreen Freud DO   Signed by:   Loreen Freud DO on 07/07/2009   Method used:   Electronically to        [facility]* (retail)       [address] [place], [place]  [address]       Ph: [phone]       Fax: [phone]   RxID:   [id number] ANTIVERT 25 MG TABS (MECLIZINE HCL) 1 by mouth qid as needed  #30 x 0   Entered and Authorized by:   Loreen Freud DO   Signed by:   Loreen Freud DO on 07/07/2009   Method used:   Electronically to        [facility]* (retail)       [address] [place], [place]  [address]       Ph: [phone]       Fax: [phone]   RxID:    [id number] ANTIVERT 25 MG TABS (MECLIZINE HCL) 1 by mouth once daily  #30 x 11   Entered and Authorized by:   Loreen Freud DO   Signed by:   Loreen Freud DO on 07/07/2009   Method used:   Electronically to        [facility]* (retail)       [address] [place], [place]  [address]       Ph: [phone]       Fax: [phone]   RxID:   [id number]    EKG  Procedure date:  07/07/2009  Findings:      Normal sinus rhythm with rate of:  74 bpm   Laboratory Results   Urine Tests    Routine Urinalysis   Color: yellow Appearance: Clear Glucose: negative   (Normal Range: Negative) Bilirubin: negative   (Normal Range: Negative) Ketone: small (15)   (  Normal Range: Negative) Spec. Gravity: 1.015   (Normal Range: 1.003-1.035) Blood: negative   (Normal Range: Negative) pH: 5.0   (Normal Range: 5.0-8.0) Protein: negative   (Normal Range: Negative) Urobilinogen: 0.2   (Normal Range: 0-1) Nitrite: negative   (Normal Range: Negative) Leukocyte Esterace: negative   (Normal Range: Negative)    Comments: Army Fossa CMA  July 07, 2009 4:59 PM  Blood Tests     CBG Random:: 144mg /dL   CBC   HGB:  16.1 g/dL   (Normal Range: 09.6-04.5 in Males, 12.0-15.0 in Females)   Flu Vaccine Consent Questions     Do you have a history of severe allergic reactions to this vaccine? no    Any prior history of allergic reactions to egg and/or gelatin? no    Do you have a sensitivity to the preservative Thimersol? no    Do you have a past history of Guillan-Barre Syndrome? no    Do you currently have an acute febrile illness? no    Have you ever had a severe reaction to latex? no    Vaccine information given and explained to patient? yes    Are you currently pregnant? no    Lot Number:[id number]   Exp Date:02/11/2010   Site Given  Left Deltoid IM.lbflu

## 2010-09-14 NOTE — Assessment & Plan Note (Signed)
Summary: back pain/cbs   Vital Signs:  Patient profile:   55 year old female Height:      63 inches Weight:      189 pounds Temp:     98.5 degrees F oral Pulse rate:   72 / minute BP sitting:   140 / 90  (left arm)  Vitals Entered By: Jeremy Johann CMA (March 23, 2010 11:09 AM) CC: pain back from shoulder to lower back, Back Pain   History of Present Illness:       This is a 55 year old woman who presents with Back Pain.  The symptoms began 6 days ago.  Pt thinks it started at work after lifting a box on Thursday.   Pain has gotten progressively worse since then.  The patient denies fever, chills, weakness, loss of sensation, fecal incontinence, urinary incontinence, urinary retention, dysuria, rest pain, inability to work, and inability to care for self.  The pain is located in the mid low back and mid thoracic back.  The pain began at work, gradually, and after lifting.  The pain radiates to the right hip and left hip.  The pain is made worse by standing or walking, flexion, and extension.  The pain is made better by inactivity, NSAID medications, and heat.  Pt also taking a natural musle relaxer.  Pt states she gets this every so often but it usually goes away in a few days.  Pt c/o numbness and tingling in L leg and pain in R hip.   That was going on before she lifted box.    Current Medications (verified): 1)  Nexium 40 Mg  Cpdr (Esomeprazole Magnesium) .... Take One Tablet Daily 2)  Freestyle Test  Strp (Glucose Blood) .... Use As Directed 3)  Freestyle Lite Test  Strp (Glucose Blood) .... Use As Directed. 4)  Flexeril 10 Mg Tabs (Cyclobenzaprine Hcl) .Marland Kitchen.. 1 By Mouth Three Times A Day As Needed Back Pain 5)  Vicodin Es 7.5-750 Mg Tabs (Hydrocodone-Acetaminophen) .Marland Kitchen.. 1 By Mouth Every 6 Hours As Needed  Allergies (verified): No Known Drug Allergies  Past History:  Past medical, surgical, family and social histories (including risk factors) reviewed for relevance to current  acute and chronic problems.  Past Medical History: Reviewed history from 02/22/2008 and no changes required. GERD Hyperlipidemia Myocardial infarction, hx of-- date unknown--informed in 2004 while in hosp for Headache Myocardial infarction, hx of  Past Surgical History: Reviewed history from 11/01/2007 and no changes required. Hysterectomy-partial Tubal ligation Breast Bx-- L breast--1984  Family History: Reviewed history from 02/22/2008 and no changes required. Family History of CAD Female 1st degree relative <60-- sister 63 yo Family History of CAD Female 1st degree relative <50 Family History Diabetes 1st degree relative Family History Hypertension-  M 55yo ---kidney ca and heart dz,  HTN  Social History: Reviewed history from 11/01/2007 and no changes required. Divorced Never Smoked Alcohol use-no Drug use-no Regular exercise-yes  Review of Systems      See HPI  Physical Exam  General:  Well-developed,well-nourished,in no acute distress; alert,appropriate and cooperative throughout examination Msk:  Pain with flexion of lumbar spine pain relief with ext + b/l slr dTR decreased b/l  Extremities:  no edema Neurologic:  see above pt walking with help of husband Skin:  Intact without suspicious lesions or rashes Psych:  Oriented X3 and normally interactive.     Impression & Recommendations:  Problem # 1:  BACK PAIN WITH RADICULOPATHY (ICD-729.2) vicodin and flexeril  warm compresses rest  Orders: T-Thoracic Spine 2 Views [id number]) T-Lumbar Spine 2 Views (72100TC)  Complete Medication List: 1)  Nexium 40 Mg Cpdr (Esomeprazole magnesium) .... Take one tablet daily 2)  Freestyle Test Strp (Glucose blood) .... Use as directed 3)  Freestyle Lite Test Strp (Glucose blood) .... Use as directed. 4)  Flexeril 10 Mg Tabs (Cyclobenzaprine hcl) .Marland Kitchen.. 1 by mouth three times a day as needed back pain 5)  Vicodin Es 7.5-750 Mg Tabs (Hydrocodone-acetaminophen) .Marland Kitchen.. 1 by  mouth every 6 hours as needed  Patient Instructions: 1)  Most patients (90%) with low back pain will improve with time ( 2-6 weeks). Keep active but avoid activities that are painful. Apply moist heat and/or ice to lower back several times a day.  Prescriptions: VICODIN ES 7.5-750 MG TABS (HYDROCODONE-ACETAMINOPHEN) 1 by mouth every 6 hours as needed  #30 x 0   Entered and Authorized by:   Loreen Freud DO   Signed by:   Loreen Freud DO on 03/23/2010   Method used:   Print then Give to Patient   RxID:   [id number] FLEXERIL 10 MG TABS (CYCLOBENZAPRINE HCL) 1 by mouth three times a day as needed back pain  #30 x 0   Entered and Authorized by:   Loreen Freud DO   Signed by:   Loreen Freud DO on 03/23/2010   Method used:   Electronically to        [facility]* (retail)       [address] [place], [place]  [address]       Ph: [phone]       Fax: [phone]   RxID:   [id number]

## 2010-09-14 NOTE — Progress Notes (Signed)
Summary: Results   Phone Note Outgoing Call   Call placed by: Almeta Monas CMA Duncan Dull),  July 12, 2010 11:25 AM Call placed to: Patient Details for Reason: + UTI ---macrobid 1 by mouth two times a day for 7 days   Summary of Call: Ideally your LDL (bad cholesterol) should be <__100__, your HDL (good cholesterol) should be >__40_ and your triglycerides should be< 150.  Diet and exercise will increase HDL and decrease the LDL and triglycerides. Read Dr. Danice Goltz book--Eat Drink and Be Healthy.  We will recheck labs in _3-6__ months.    + anemia----Hgb is low-----take MVI with iron recheck 3 months as well.   285.9  272.4  hep, lipid, cbcd, ibc, ferritin, b12, bmp  Initial call taken by: Almeta Monas CMA Duncan Dull),  July 12, 2010 11:25 AM  Follow-up for Phone Call        Left message to call back... Almeta Monas CMA Duncan Dull)  July 14, 2010 3:01 PM  Discuss with patient, rx sent to pharmacy, copy of labs mailed..............Marland KitchenFelecia Deloach CMA  July 15, 2010 2:58 PM     New/Updated Medications: MACROBID 100 MG CAPS (NITROFURANTOIN MONOHYD MACRO) Take 1 by mouth two times a day for 7 days MULTIPLE VITAMINS/IRON  TABS (MULTIPLE VITAMINS-IRON) Take 1 tab once daily Prescriptions: MACROBID 100 MG CAPS (NITROFURANTOIN MONOHYD MACRO) Take 1 by mouth two times a day for 7 days  #14 x 0   Entered by:   Jeremy Johann CMA   Authorized by:   Almeta Monas CMA (AAMA)   Signed by:   Jeremy Johann CMA on 07/15/2010   Method used:   Faxed to ...       [facility]* (retail)       [address] [place], [place]  [address]       Ph: [phone]       Fax: [phone]   RxID:   [id number]

## 2010-09-14 NOTE — Progress Notes (Signed)
Summary: Pian not better 10/18  Phone Note Call from Patient   Caller: Patient Call For: Loreen Freud DO Details for Reason: Return call Summary of Call: Spk with pt and she stated she was returning a call. Gave her the mammogram appt. pt stated she has been off of work for the last two days due to the pain, says she may need a work note. Per pt when she is not on the meds the pain is worse. Pt stated she is currently taking Ibuprofen, flexeril and Naproxen at different times and it is not really helping. Please advise on if it's ok to wrte a work note and if you would like to call in something different for te pt's pain. pt c/b number [phone]. Initial call taken by: Almeta Monas CMA Duncan Dull),  June 01, 2010 9:24 AM  Follow-up for Phone Call        vicodin printed when is ortho appoint? if it is in the next week or so----write note until appointment. Follow-up by: Loreen Freud DO,  June 01, 2010 10:04 AM  Additional Follow-up for Phone Call Additional follow up Details #1::        Pt aware and she will have husband come in to pickup rx and work note..... Almeta Monas CMA Duncan Dull)  June 01, 2010 1:37 PM  Additional Follow-up by: Almeta Monas CMA Duncan Dull),  June 01, 2010 1:37 PM    New/Updated Medications: VICODIN ES 7.5-750 MG TABS (HYDROCODONE-ACETAMINOPHEN) 1 by mouth every 6 hours as needed Prescriptions: VICODIN ES 7.5-750 MG TABS (HYDROCODONE-ACETAMINOPHEN) 1 by mouth every 6 hours as needed  #30 x 0   Entered and Authorized by:   Loreen Freud DO   Signed by:   Loreen Freud DO on 06/01/2010   Method used:   Print then Mail to Patient   RxID:   [id number]

## 2010-09-16 NOTE — Assessment & Plan Note (Signed)
Summary: cold. cough, flu like symptoms///sph   Vital Signs:  Patient profile:   55 year old female Menstrual status:  hysterectomy Height:      63 inches Weight:      185 pounds BMI:     32.89 O2 Sat:      99 % on Room air Temp:     94.4 degrees F oral Pulse rate:   117 / minute BP sitting:   130 / 76  (left arm)  Vitals Entered By: Jeremy Johann CMA (September 01, 2010 1:04 PM)  O2 Flow:  Room air CC: cough,fever,chills,bodyache, nausea, refill   History of Present Illness: 55 yo woman here today for flu like sxs.  sxs started as a dry cough prior to the holidays.  this weekend had 7 children in the house (2 of whom were sick).  now having subjective fevers, chills, body aches, ear pain, sore throat, cough- productive in the AM.  'yesterday i felt like an elephant sat on me- i just hurt'.  no SOB.    Current Medications (verified): 1)  Nexium 40 Mg  Cpdr (Esomeprazole Magnesium) .... Take One Tablet Daily 2)  Freestyle Test  Strp (Glucose Blood) .... Use As Directed 3)  Freestyle Lite Test  Strp (Glucose Blood) .... Use As Directed. 4)  Centrum Ultra Womens  Tabs (Multiple Vitamins-Minerals) .Marland Kitchen.. 1 By Mouth Once Daily 5)  Ibuprofen 800 Mg Tabs (Ibuprofen) .... By Mouth As Needed 6)  Aleve 220 Mg Tabs (Naproxen Sodium) .... By Mouth As Needed 7)  Multiple Vitamins/iron  Tabs (Multiple Vitamins-Iron) .... Take 1 Tab Once Daily  Allergies (verified): No Known Drug Allergies  Review of Systems      See HPI  Physical Exam  General:  Well-developed,well-nourished,in no acute distress; alert,appropriate and cooperative throughout examination Head:  NCAT, + TTP over frontal and maxillary sinuses Eyes:  no injxn or inflammation Ears:  External ear exam shows no significant lesions or deformities.  Otoscopic examination reveals clear canals, tympanic membranes are intact bilaterally without bulging, retraction, inflammation or discharge. Hearing is grossly normal  bilaterally. Nose:  + congestion Mouth:  mild pharyngeal erythema Neck:  mild bilateral ant chain LAD Lungs:  CTAB, hacking cough Heart:  normal rate and no murmur.     Impression & Recommendations:  Problem # 1:  COUGH (ICD-786.2) Assessment New most likely bronchitis.  flu test (-).  start clarthromycin to treat both bronchitis and sinusitis.  cough meds as needed.  reviewed supportive care and red flags that should prompt return.  Pt expresses understanding and is in agreement w/ this plan. Orders: Flu A+B (81191)  Problem # 2:  SINUSITIS - ACUTE-NOS (ICD-461.9) Assessment: New start abx.  reviewed supportive care and red flags that should prompt return.  Pt expresses understanding and is in agreement w/ this plan. Her updated medication list for this problem includes:    Clarithromycin 500 Mg Xr24h-tab (Clarithromycin) .Marland Kitchen... 2 tabs daily x10 days    Tessalon 200 Mg Caps (Benzonatate) .Marland Kitchen... Take one capsule by mouth three times a day as needed for cough    Cheratussin Ac 100-10 Mg/14ml Syrp (Guaifenesin-codeine) .Marland Kitchen... 1-2 tsps q4-6 as needed for cough.  will cause drowsiness  Complete Medication List: 1)  Nexium 40 Mg Cpdr (Esomeprazole magnesium) .... Take one tablet daily 2)  Freestyle Test Strp (Glucose blood) .... Use as directed 3)  Freestyle Lite Test Strp (Glucose blood) .... Use as directed. 4)  Centrum Ultra Womens Tabs (Multiple vitamins-minerals) .Marland KitchenMarland KitchenMarland Kitchen  1 by mouth once daily 5)  Ibuprofen 800 Mg Tabs (Ibuprofen) .... By mouth as needed 6)  Aleve 220 Mg Tabs (Naproxen sodium) .... By mouth as needed 7)  Multiple Vitamins/iron Tabs (Multiple vitamins-iron) .... Take 1 tab once daily 8)  Clarithromycin 500 Mg Xr24h-tab (Clarithromycin) .... 2 tabs daily x10 days 9)  Tessalon 200 Mg Caps (Benzonatate) .... Take one capsule by mouth three times a day as needed for cough 10)  Cheratussin Ac 100-10 Mg/23ml Syrp (Guaifenesin-codeine) .Marland Kitchen.. 1-2 tsps q4-6 as needed for cough.  will  cause drowsiness  Patient Instructions: 1)  You have a sinus infection and a bronchitis 2)  Take the Clarithromycin for the infection- take w/ food to avoid upset stomach 3)  Drink plenty of fluids 4)  Tylenol/Ibuprofen as needed for pain or fever 5)  REST! 6)  Add mucinex to thin your congestion 7)  Cough meds as needed- the syrup will make you drowsy 8)  Call with any questions or concerns 9)  Hang in there! Prescriptions: CHERATUSSIN AC 100-10 MG/5ML SYRP (GUAIFENESIN-CODEINE) 1-2 tsps Q4-6 as needed for cough.  will cause drowsiness  #119ml x 0   Entered and Authorized by:   Neena Rhymes MD   Signed by:   Neena Rhymes MD on 09/01/2010   Method used:   Print then Give to Patient   RxID:   [id number] TESSALON 200 MG CAPS (BENZONATATE) Take one capsule by mouth three times a day as needed for cough  #60 x 0   Entered and Authorized by:   Neena Rhymes MD   Signed by:   Neena Rhymes MD on 09/01/2010   Method used:   Electronically to        [facility]* (retail)       [address] [place], [place]  [address]       Ph: [phone]       Fax: [phone]   RxID:   [id number] CLARITHROMYCIN 500 MG XR24H-TAB (CLARITHROMYCIN) 2 tabs daily x10 days  #20 x 0   Entered and Authorized by:   Neena Rhymes MD   Signed by:   Neena Rhymes MD on 09/01/2010   Method used:   Electronically to        [facility]* (retail)       [address] [place], [place]  [address]       Ph: [phone]       Fax: [phone]   RxID:   [id number]    Orders Added: 1)  Flu A+B [87400] 2)  Est. Patient Level III [58527]    Laboratory Results    Other Tests  Influenza A: negative Influenza B: negative

## 2010-09-17 ENCOUNTER — Encounter: Payer: Self-pay | Admitting: Family Medicine

## 2010-09-17 ENCOUNTER — Ambulatory Visit (INDEPENDENT_AMBULATORY_CARE_PROVIDER_SITE_OTHER): Payer: PRIVATE HEALTH INSURANCE | Admitting: Family Medicine

## 2010-09-17 DIAGNOSIS — J019 Acute sinusitis, unspecified: Secondary | ICD-10-CM

## 2010-09-20 ENCOUNTER — Ambulatory Visit: Payer: Self-pay | Admitting: Family Medicine

## 2010-09-22 NOTE — Assessment & Plan Note (Signed)
Summary: itching all over body/kn   Vital Signs:  Patient profile:   55 year old female Menstrual status:  hysterectomy Weight:      187.6 pounds Temp:     98.0 degrees F oral BP sitting:   140 / 80  (left arm) Cuff size:   large  Vitals Entered By: Almeta Monas CMA Duncan Dull) (September 17, 2010 11:28 AM) CC: still has URI--Stated she is having itching all over the body including vaginal area/since completing meds---No vag discharge, URI symptoms   History of Present Illness:       This is a 55 year old woman who presents with URI symptoms.  The patient complains of nasal congestion, purulent nasal discharge, sore throat, productive cough, earache, and sick contacts, but denies clear nasal discharge and dry cough.  The patient denies fever, low-grade fever (<100.5 degrees), fever of 100.5-103 degrees, fever of 103.1-104 degrees, fever to >104 degrees, stiff neck, dyspnea, wheezing, rash, vomiting, diarrhea, use of an antipyretic, and response to antipyretic.  The patient also reports headache.  The patient denies itchy watery eyes, itchy throat, sneezing, seasonal symptoms, response to antihistamine, muscle aches, and severe fatigue.  The patient denies the following risk factors for Strep sinusitis: unilateral facial pain, unilateral nasal discharge, poor response to decongestant, double sickening, tooth pain, Strep exposure, tender adenopathy, and absence of cough.         The patient complains of productive cough, but denies nasal congestion, clear nasal discharge, purulent nasal discharge, sore throat, dry cough, earache, and sick contacts.  The patient denies fever, low-grade fever (<100.5 degrees), fever of 100.5-103 degrees, fever of 103.1-104 degrees, fever to >104 degrees, stiff neck, dyspnea, wheezing, rash, vomiting, diarrhea, use of an antipyretic, and response to antipyretic.  The patient denies itchy watery eyes, itchy throat, sneezing, seasonal symptoms, response to antihistamine,  headache, muscle aches, and severe fatigue.  The patient denies the following risk factors for Strep sinusitis: unilateral facial pain, unilateral nasal discharge, poor response to decongestant, double sickening, tooth pain, Strep exposure, tender adenopathy, and absence of cough.    Current Medications (verified): 1)  Nexium 40 Mg  Cpdr (Esomeprazole Magnesium) .... Take One Tablet Daily 2)  Freestyle Test  Strp (Glucose Blood) .... Use As Directed 3)  Freestyle Lite Test  Strp (Glucose Blood) .... Use As Directed. 4)  Centrum Ultra Womens  Tabs (Multiple Vitamins-Minerals) .Marland Kitchen.. 1 By Mouth Once Daily 5)  Ibuprofen 800 Mg Tabs (Ibuprofen) .... By Mouth As Needed 6)  Aleve 220 Mg Tabs (Naproxen Sodium) .... By Mouth As Needed 7)  Multiple Vitamins/iron  Tabs (Multiple Vitamins-Iron) .... Take 1 Tab Once Daily 8)  Avelox 400 Mg Tabs (Moxifloxacin Hcl) .Marland Kitchen.. 1 By Mouth Once Daily  Allergies (verified): No Known Drug Allergies  Past History:  Past Medical History: Last updated: 02/22/2008 GERD Hyperlipidemia Myocardial infarction, hx of-- date unknown--informed in 2004 while in hosp for Headache Myocardial infarction, hx of  Past Surgical History: Last updated: 11/01/2007 Hysterectomy-partial Tubal ligation Breast Bx-- L breast--1984  Family History: Last updated: 02/22/2008 Family History of CAD Female 1st degree relative <60-- sister 21 yo Family History of CAD Female 1st degree relative <50 Family History Diabetes 1st degree relative Family History Hypertension-  M 55yo ---kidney ca and heart dz,  HTN  Social History: Last updated: 07/06/2010 Divorced Never Smoked Alcohol use-no Drug use-no Regular exercise-yes 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 Family History of CAD Female 1st degree relative <60-- sister 80 yo Family History of CAD Female 1st degree relative <50 Family History Diabetes 1st degree relative Family History Hypertension-  M 55yo ---kidney ca and heart dz,  HTN  Social History: Reviewed history from 07/06/2010 and no changes required. 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  Otoscopic examination reveals clear canals, tympanic membranes are intact bilaterally without bulging, retraction, inflammation or discharge. Hearing is grossly normal bilaterally. Nose:  External nasal examination shows no deformity or inflammation. Nasal mucosa are pink and moist without lesions or exudates. Mouth:  Oral mucosa and oropharynx without lesions or exudates.  Teeth in good repair. Neck:  No deformities, masses, or tenderness noted. Lungs:  normal respiratory effort, no intercostal retractions, no accessory muscle use, and normal breath sounds.  occassional wheeze with exp Heart:  normal rate and no murmur.   Extremities:  No clubbing, cyanosis, edema, or deformity noted with normal full range of motion of all joints.   Skin:  no rash  Cervical Nodes:  No lymphadenopathy noted Psych:  Cognition and judgment appear intact. Alert and cooperative with normal attention span and concentration. No apparent delusions, illusions, hallucinations   Impression & Recommendations:  Problem # 1:  SINUSITIS - ACUTE-NOS (ICD-461.9)  The following medications were removed from the medication list:    Clarithromycin 500 Mg Xr24h-tab (Clarithromycin) .Marland Kitchen... 2 tabs  daily x10 days    Tessalon 200 Mg Caps (Benzonatate) .Marland Kitchen... Take one capsule by mouth three times a day as needed for cough    Cheratussin Ac 100-10 Mg/4ml Syrp (Guaifenesin-codeine) .Marland Kitchen... 1-2 tsps q4-6 as needed for cough.  will cause drowsiness Her updated medication list for this problem includes:    Avelox 400 Mg Tabs (Moxifloxacin hcl) .Marland Kitchen... 1 by mouth once daily  Instructed on treatment. Call if symptoms persist or worsen.   Complete Medication List: 1)  Nexium 40 Mg Cpdr (Esomeprazole magnesium) .... Take one tablet daily 2)  Freestyle Test Strp (Glucose blood) .... Use as directed 3)  Freestyle Lite Test Strp (Glucose blood) .... Use as directed. 4)  Centrum Ultra Womens Tabs (Multiple vitamins-minerals) .Marland Kitchen.. 1 by mouth once daily 5)  Ibuprofen 800 Mg Tabs (Ibuprofen) .... By mouth as needed 6)  Aleve 220 Mg Tabs (Naproxen sodium) .... By mouth as needed 7)  Multiple Vitamins/iron Tabs (Multiple vitamins-iron) .... Take 1 tab once daily 8)  Avelox 400 Mg Tabs (Moxifloxacin hcl) .Marland Kitchen.. 1 by mouth once daily Prescriptions: AVELOX 400 MG TABS (MOXIFLOXACIN HCL) 1 by mouth once daily  #5 x 0   Entered and Authorized by:   Loreen Freud DO   Signed by:   Loreen Freud DO on 09/17/2010   Method used:   Electronically to        [facility]* (retail)       [address] [place], [place]  [address]       Ph: [phone]       Fax: [phone]   RxID:  [id number]    Orders Added: 1)  Est. Patient Level III [95621]

## 2010-09-27 ENCOUNTER — Ambulatory Visit (INDEPENDENT_AMBULATORY_CARE_PROVIDER_SITE_OTHER)
Admission: RE | Admit: 2010-09-27 | Discharge: 2010-09-27 | Disposition: A | Discharge status: Home / Self Care | Payer: PRIVATE HEALTH INSURANCE | Source: Ambulatory Visit | Attending: Family Medicine | Admitting: Family Medicine

## 2010-09-27 ENCOUNTER — Telehealth: Payer: Self-pay | Admitting: Family Medicine

## 2010-09-27 ENCOUNTER — Other Ambulatory Visit: Payer: Self-pay | Admitting: Family Medicine

## 2010-09-27 DIAGNOSIS — R05 Cough: Secondary | ICD-10-CM

## 2010-09-30 ENCOUNTER — Telehealth (INDEPENDENT_AMBULATORY_CARE_PROVIDER_SITE_OTHER): Payer: Self-pay | Admitting: *Deleted

## 2010-10-05 ENCOUNTER — Encounter: Payer: Self-pay | Admitting: Internal Medicine

## 2010-10-05 ENCOUNTER — Other Ambulatory Visit: Payer: Self-pay | Admitting: Internal Medicine

## 2010-10-05 ENCOUNTER — Institutional Professional Consult (permissible substitution) (INDEPENDENT_AMBULATORY_CARE_PROVIDER_SITE_OTHER): Payer: PRIVATE HEALTH INSURANCE | Admitting: Internal Medicine

## 2010-10-05 DIAGNOSIS — J019 Acute sinusitis, unspecified: Secondary | ICD-10-CM

## 2010-10-05 DIAGNOSIS — R05 Cough: Secondary | ICD-10-CM

## 2010-10-05 DIAGNOSIS — J40 Bronchitis, not specified as acute or chronic: Secondary | ICD-10-CM

## 2010-10-05 DIAGNOSIS — K219 Gastro-esophageal reflux disease without esophagitis: Secondary | ICD-10-CM

## 2010-10-06 NOTE — Progress Notes (Signed)
Summary: new rx w/o codene  Phone Note Refill Request   Refills Requested: Medication #1:  CHERATUSSIN AC 100-10 MG/5ML SYRP 1-2 tsp by mouth every 6 hours as needed for cough. [facility] - [phone] note from pharmacy "patient had a reaction to codene last time & requests new rx w/o codene"    Initial call taken by: Okey Regal [place],  September 30, 2010 8:27 AM  Follow-up for Phone Call        please advise Follow-up by: Almeta Monas CMA Duncan Dull),  September 30, 2010 9:19 AM  Additional Follow-up for Phone Call Additional follow up Details #1::        tessalon perles 200 1 by mouth three times a day as needed cough--- #30  no refills Additional Follow-up by: Loreen Freud DO,  September 30, 2010 9:55 AM   New Allergies: ! CODEINE Additional Follow-up for Phone Call Additional follow up Details #2::    Called pt to inform of change in rx says she has been on medication previously and it didn't help patient upset that she isn't being giving anything to help w/ cough and didn't want prescription informed patient that she could get med otc but wasn't happy w/ this information. Called pharmacy to cancel prescription  Follow-up by: Doristine Devoid CMA,  September 30, 2010 4:00 PM  Additional Follow-up for Phone Call Additional follow up Details #3:: Details for Additional Follow-up Action Taken: I took call after the patient spoke with Chemira and she was upset, didin't want to get an RX with Codiene. I advised we called in another RX to the pharmacy [facility] Tessalon Perles and it doesn't have codeine and I advised we listed Codiene as an allergy, pt got upset and I advised because this is what she told us and the pharmacy. She disagreed, I advised we would not give her anyhting that she thought would make her sick so we listed it as an allergy, pt stated she was frustrated because she had been coughing for the last 3 months, I advised I understood, and If she doesn't feel like these meds are working, I can  offer a referral to a pulmonary doctor if she is ok, pt agreed. She was coughing constantly and I advised her to take a drink of water and we will contact her with appt. I advised Rx was at the pharmacy and she declined, she said she did not want it, I advised I will call and cancel it. I advised she can try otc delsym and she stated that did not work. I advised again I would speak with Dr.Lowne and will continiue to go forward with getting her help, she voiced understanding. Per Dr.Lowne ok to put in order for pulmonary. i sent it for an ASAP appt to [facility].....    Additional Follow-up by: Almeta Monas CMA Duncan Dull),  September 30, 2010 3:57 PM  New/Updated Medications: TESSALON 200 MG CAPS (BENZONATATE) take one three times a day as needed for cough New Allergies: ! CODEINEPrescriptions: TESSALON 200 MG CAPS (BENZONATATE) take one three times a day as needed for cough  #30 x 0   Entered by:   Doristine Devoid CMA   Authorized by:   Loreen Freud DO   Signed by:   Doristine Devoid CMA on 09/30/2010   Method used:   Electronically to        [facility]* (retail)       [address] [facility]  [place], [place]  [address]       Ph: [phone]       Fax: [phone]   RxID:   [id number]

## 2010-10-06 NOTE — Progress Notes (Signed)
Summary: still no better  Phone Note Call from Patient Call back at Work Phone [phone]   Summary of Call: Pt states that cough and itching is still not better . Pt c/o vomiting and body pain due to coughing so much. Pls advise............Marland KitchenFelecia Deloach CMA  September 27, 2010 11:51 AM    Follow-up for Phone Call        avelox 1 by mouth once daily for 10 days cheratussin 1-2 tsp by mouth every 6 hours as needed  6oz ---warn pt it will make her tired also need Chest xray--- ov if no better after this Follow-up by: Loreen Freud DO,  September 27, 2010 12:23 PM  Additional Follow-up for Phone Call Additional follow up Details #1::        pt aware of the above.... Additional Follow-up by: Almeta Monas CMA Duncan Dull),  September 27, 2010 1:29 PM  New Problems: COUGH (ICD-786.2)   New Problems: COUGH (ICD-786.2) New/Updated Medications: AVELOX 400 MG TABS (MOXIFLOXACIN HCL) 1 by mouth once daily x10 days CHERATUSSIN AC 100-10 MG/5ML SYRP (GUAIFENESIN-CODEINE) 1-2 tsp by mouth every 6 hours as needed for cough Prescriptions: CHERATUSSIN AC 100-10 MG/5ML SYRP (GUAIFENESIN-CODEINE) 1-2 tsp by mouth every 6 hours as needed for cough  #1 x 0   Entered by:   Almeta Monas CMA (AAMA)   Authorized by:   Loreen Freud DO   Signed by:   Almeta Monas CMA (AAMA) on 09/27/2010   Method used:   Printed then faxed to ...       [facility]* (retail)       [address] [place], [place]  [address]       Ph: [phone]       Fax: [phone]   RxID:   [id number] AVELOX 400 MG TABS (MOXIFLOXACIN HCL) 1 by mouth once daily x10 days  #10 x 0   Entered by:   Almeta Monas CMA (AAMA)   Authorized by:   Loreen Freud DO   Signed by:   Almeta Monas CMA (AAMA) on 09/27/2010   Method used:   Electronically to        [facility]* (retail)       [address] [place], [place]  [address]       Ph: [phone]       Fax:  [phone]   RxID:   [id number]

## 2010-10-07 ENCOUNTER — Other Ambulatory Visit (HOSPITAL_COMMUNITY): Payer: Self-pay | Admitting: Internal Medicine

## 2010-10-07 DIAGNOSIS — R05 Cough: Secondary | ICD-10-CM

## 2010-10-12 NOTE — Assessment & Plan Note (Addendum)
Summary: per dr lowne/ 3 month cough w/ no help from meds/mhh   Primary Provider/Referring Provider:  Laury Axon  CC:  visit per Dr. Steward Drone cough dry x 1 1/2 mths., sinus inf. in Jan. 2012, occass. sob w/exertion, and weakness occass. no wheezing; Vomiting everyday(liquids-such as water) make it worse.Marland Kitchen  History of Present Illness: October 05, 2010- [name] referred courtesy of Dr Laury Axon because of persistent cough. Never smoker. Began coughing without distinct onset, in late November or early December. Then caught a distinct cold in January with ache, no fever. Productive then with yellow/ white mucus from nose and chest. Dx'd sinusitis/ bronchitis and treated with Biaxin, Avelox, Cheratussin/ codeine cough syrup. Since then, the mucus is gone. She did sneeze hard once, suddenly finding a large bloody plug  in her mouth with self limited epistaxis from left nare. Still finishing Avelox. Still coughing- described as throat tickle that takes her breath, nonproductive cough. cough can wake her, but nonpositional. Denies fever, chills, frankly purulrnt discharge, headache, ear pain, chest pain or palpitation.  Triggers- weather/ temperature change, odors. In October, 2011 had hrt her back. Got prednisone and Vicodin in October. Had 3 trips to ER for back pain before injection in back resolved that pain. Pain meds made her "drifty" for awhile. Hx GERD, seeming controlled on Nexium. Trying to swallow feels water down wrong way. Noticing this more in last 2 years. Doesn't recall any major obvious reflux/ aspiration event.  CXR- Clear on 09/27/10.   Preventive Screening-Counseling & Management  Alcohol-Tobacco     Smoking Status: never  Current Medications (verified): 1)  Nexium 40 Mg  Cpdr (Esomeprazole Magnesium) .... Take One Tablet Daily 2)  Centrum Ultra Womens  Tabs (Multiple Vitamins-Minerals) .Marland Kitchen.. 1 By Mouth Once Daily 3)  Ibuprofen 800 Mg Tabs (Ibuprofen) .... By Mouth As Needed 4)  Aleve 220 Mg  Tabs (Naproxen Sodium) .... By Mouth As Needed 5)  Multiple Vitamins/iron  Tabs (Multiple Vitamins-Iron) .... Take 1 Tab Once Daily 6)  Avelox 400 Mg Tabs (Moxifloxacin Hcl) .Marland Kitchen.. 1 By Mouth Once Daily X10 Days 7)  Tessalon 200 Mg Caps (Benzonatate) .... Take One Three Times A Day As Needed For Cough  Allergies (verified): 1)  ! 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  The patient denies productive cough, coughing up blood, irregular heartbeats, acid heartburn, indigestion, loss of appetite, weight change, abdominal pain, difficulty swallowing, sore throat, tooth/dental problems, headaches, nasal congestion/difficulty breathing through nose, sneezing, itching, ear ache, anxiety, depression, hand/feet swelling, joint stiffness or pain, rash, change in color of mucus, and fever.    Vital Signs:  Patient profile:   55 year old female Menstrual status:  hysterectomy Height:      63 inches Weight:      189.25 pounds O2 Sat:      97 % on Room air Temp:     97.9 degrees F oral Pulse rate:   97 / minute BP sitting:   144 / 82  (right arm) Cuff size:   regular  Vitals Entered  By: Casey Buba RN (October 05, 2010 10:07 AM)  O2 Flow:  Room air CC: visit per Dr. Steward Drone cough dry x 1 1/2 mths.,sinus inf. in Jan. 2012,occass. sob w/exertion,weakness occass. no wheezing; Vomiting everyday(liquids-such as water) make it worse. Is Patient Diabetic? No Comments Medications reviewed with patient ,phone #  verified.Casey Buba RN  October 05, 2010 10:12 AM    Physical Exam  Additional Exam:  General: A/Ox3; pleasant and cooperative, NAD,mildly overweight SKIN: no rash, lesions NODES: no lymphadenopathy HEENT: [place]/AT, EOM- WNL, Conjuctivae- clear, periorbital edema, PERRLA, TM-WNL, Nose- few small clots left nare, Throat- clear and wnl, voice normal, no stridor NECK: Supple w/ fair ROM, JVD- none, normal carotid impulses w/o bruits Thyroid- normal to palpation CHEST: Clear to P&A. Paroxysmal cough. Shallow breaths HEART: RRR, no m/g/r heard ABDOMEN: Soft and nl; nml bowel sounds; no organomegaly or masses noted ZOX:WRUE, nl pulses, no edema  NEURO: Grossly intact to observation      Impression & Recommendations:  Problem # 1:  COUGH (ICD-786.2)  Desribes acute onset. From the timing, I wonder if while she was being somewhat sedated with meds for her back pain,  she may have begun some aspritation. i don'tget hx of overt aspiration, but suspect repeated laryngeal penetration may be sustaining her cough,. Tessalon not much help and allergic to codeine with itching and rash. Will try a near-narcotic tramadol. Will get MBS/ speech therapy eval of her swallowing competence. CXR is clear, so the plug she found in mouth along with blood after sneeze, nmay have been clot from sinus. will get limted CT sinus. Will try neb and depo today for ? bronchitis as she finishes avelox.  We will try neb and depo. Try tramadol for cough. It may be distant enough to avoid codeine related itching.   Problem # 2:  GERD (ICD-530.81)  Hx of GERD - aware of some reflux, on Nexium, but not of significant heartburn or choking from reflux events. Her updated medication list for this problem includes:    Nexium 40 Mg Cpdr (Esomeprazole magnesium) .Marland Kitchen... Take one tablet daily  Medications Added to Medication List This Visit: 1)  Tramadol Hcl 50 Mg Tabs (Tramadol hcl) .Marland Kitchen.. 1 three times a day as needed  cough  Other Orders: Consultation Level IV (45409) Depo- Medrol 80mg  (J1040) Admin of Therapeutic Inj  intramuscular or subcutaneous (81191) Nebulizer Tx (47829) Radiology Referral (Radiology) Speech Therapy (Speech Therapy)  Patient Instructions: 1)  Please schedule a follow-up appointment in 3 weeks. 2)  See [facility] to schedule modified barium swallow with Speech Therapy assistance 3)  A Limited Sinus CT has been recommended.  Your imaging study may require preauthorization.  4)  Neb a 5)  depo 80 6)  script try tramadol for cough. I don't think it will make you itch like codeine, but it is possible. If you start itching, then don't take it any more.  7)  cc Dr Laury Axon Prescriptions: TRAMADOL HCL 50 MG TABS (TRAMADOL HCL) 1 three times a day as needed cough  #15 x 0   Entered and Authorized by:   Waymon Budge MD   Signed by:   Waymon Budge MD on 10/05/2010   Method used:   Print then Give to Patient   RxID:   [id number]      Medication Administration  Injection # 1:    Medication: Depo- Medrol 80mg     Diagnosis: COUGH (ICD-786.2)    Route: IM    Site: LUOQ  gluteus    Exp Date: 02/2013    Lot #: [id number]    Mfr: Pharmacia    Patient tolerated injection without complications    Given by: Casey Buba RN (October 05, 2010 11:20 AM)  Medication # 1:    Medication: Albuterol Sulfate Sol 1mg  unit dose    Diagnosis: COUGH (ICD-786.2)    Dose: 1 vial    Route: inhaled    Exp Date: 09/2011    Lot #: [id number]    Mfr: Nephron    Patient tolerated medication without complications    Given by: Casey Buba RN (October 05, 2010 11:21 AM)  Orders Added: 1)  Consultation Level IV [75643] 2)  Depo- Medrol 80mg  [J1040] 3)  Admin of Therapeutic Inj  intramuscular or subcutaneous [96372] 4)  Nebulizer Tx [32951] 5)  Radiology Referral [Radiology] 6)  Speech Therapy [Speech Therapy]

## 2010-10-15 ENCOUNTER — Ambulatory Visit (HOSPITAL_COMMUNITY)
Admission: RE | Admit: 2010-10-15 | Discharge: 2010-10-15 | Disposition: A | Discharge status: Home / Self Care | Payer: PRIVATE HEALTH INSURANCE | Source: Ambulatory Visit | Attending: Internal Medicine | Admitting: Internal Medicine

## 2010-10-15 ENCOUNTER — Other Ambulatory Visit: Payer: Self-pay | Admitting: Internal Medicine

## 2010-10-15 DIAGNOSIS — R05 Cough: Secondary | ICD-10-CM

## 2010-10-15 DIAGNOSIS — R059 Cough, unspecified: Secondary | ICD-10-CM | POA: Insufficient documentation

## 2010-10-15 DIAGNOSIS — J329 Chronic sinusitis, unspecified: Secondary | ICD-10-CM

## 2010-10-23 ENCOUNTER — Encounter: Payer: Self-pay | Admitting: Family Medicine

## 2010-10-28 LAB — DIFFERENTIAL
Eosinophils Absolute: 0.1 10*3/uL (ref 0.0–0.7)
Eosinophils Relative: 1 % (ref 0–5)
Lymphocytes Relative: 33 % (ref 12–46)
Lymphs Abs: 1.7 10*3/uL (ref 0.7–4.0)
Monocytes Relative: 6 % (ref 3–12)
Neutrophils Relative %: 59 % (ref 43–77)

## 2010-10-28 LAB — CBC
Hemoglobin: 11.6 g/dL — ABNORMAL LOW (ref 12.0–15.0)
MCH: 29.9 pg (ref 26.0–34.0)
MCV: 86.9 fL (ref 78.0–100.0)
RBC: 3.87 MIL/uL (ref 3.87–5.11)
WBC: 5.3 10*3/uL (ref 4.0–10.5)

## 2010-10-28 LAB — URINE MICROSCOPIC-ADD ON

## 2010-10-28 LAB — URINALYSIS, ROUTINE W REFLEX MICROSCOPIC
Bilirubin Urine: NEGATIVE
Glucose, UA: NEGATIVE mg/dL
Ketones, ur: NEGATIVE mg/dL
Protein, ur: NEGATIVE mg/dL
Protein, ur: NEGATIVE mg/dL
Urobilinogen, UA: 0.2 mg/dL (ref 0.0–1.0)
pH: 6 (ref 5.0–8.0)

## 2010-10-28 LAB — BASIC METABOLIC PANEL
CO2: 28 mEq/L (ref 19–32)
Chloride: 104 mEq/L (ref 96–112)
GFR calc Af Amer: >60 mL/min (ref >60)
Sodium: 141 mEq/L (ref 135–145)

## 2010-11-01 ENCOUNTER — Ambulatory Visit: Payer: PRIVATE HEALTH INSURANCE | Admitting: Internal Medicine

## 2010-11-16 ENCOUNTER — Ambulatory Visit: Payer: PRIVATE HEALTH INSURANCE | Admitting: Internal Medicine

## 2010-11-17 ENCOUNTER — Encounter: Payer: Self-pay | Admitting: Internal Medicine

## 2010-11-26 NOTE — Progress Notes (Signed)
Quick Note:  I have left a message on pts home number-tried to reach on Cell number as well; asked that pt call me Monday morning. ______

## 2010-12-10 NOTE — Progress Notes (Signed)
Quick Note:  I have attempted to reach patient at [facility], and cell number-left message at home and cell numbers for pt to call back. ______

## 2010-12-13 ENCOUNTER — Encounter: Payer: Self-pay | Admitting: *Deleted

## 2010-12-13 NOTE — Progress Notes (Signed)
Quick Note:  After several attempts to contact patient-I have sent a letter to have patient call for her results. ______

## 2011-04-26 ENCOUNTER — Encounter: Payer: Self-pay | Admitting: Family Medicine

## 2011-04-26 ENCOUNTER — Ambulatory Visit (INDEPENDENT_AMBULATORY_CARE_PROVIDER_SITE_OTHER): Payer: PRIVATE HEALTH INSURANCE | Admitting: Family Medicine

## 2011-04-26 VITALS — BP 140/90 | HR 81 | Temp 98.0°F | Wt 193.0 lb

## 2011-04-26 DIAGNOSIS — L989 Disorder of the skin and subcutaneous tissue, unspecified: Secondary | ICD-10-CM

## 2011-04-26 DIAGNOSIS — B354 Tinea corporis: Secondary | ICD-10-CM

## 2011-04-26 MED ORDER — NYSTATIN 100000 UNIT/GM EX POWD: CUTANEOUS | Status: AC

## 2011-04-26 NOTE — Progress Notes (Signed)
  Subjective:     Casey Carrillo is a 55 y.o. female who was referred to me for evaluation and treatment of probable tinea. Lesion is located on the chest. Symptoms include rash located under breasts. Symptoms have been ongoing for about several months. Previous visits for this problem: none. Previous evaluation and treatment has included OTC antifungal: somewhat effective with fair  improvement.   The following portions of the patient's history were reviewed and updated as appropriate: allergies, current medications, past family history, past medical history, past social history, past surgical history and problem list.  Review of Systems Pertinent items are noted in HPI.   Objective:    Physical Exam Locations:  chest and under breasts  Description:   Hyperpigmented patches under breasts  Lesion size:  3 in.  Other Findings:  excoriations  KOH:   not done  Woods lamp:   not done    Assessment:    Probable tinea corporis   Plan:    1. nizoral powder  2. Written patient instruction given.  3. Follow up as needed for acute illness.

## 2011-04-26 NOTE — Patient Instructions (Signed)
Yeast Infection of the Skin Some yeast on the skin is normal, but sometimes it causes an infection. If you have an infection, it shows up as white or light brown patches on brown skin. You can see it better in the summer on tan skin. It causes light-colored holes in your suntan. It can happen on any area of the body. This cannot be passed from person to person. HOME CARE  Scrub your skin daily with a dandruff shampoo. Your rash may take a couple weeks to get well.   Do not scratch or itch the rash.  GET HELP IF:  You get another infection from scratching. The skin may get warm, red, and may ooze fluid.   The infection does not seem to be getting better.  Document Released: 07/14/2008 Document Re-Released: 01/19/2010 [employer] Patient Information 2011 [employer]. 

## 2011-05-04 ENCOUNTER — Encounter: Payer: Self-pay | Admitting: Family Medicine

## 2011-05-04 ENCOUNTER — Telehealth: Payer: Self-pay | Admitting: Family Medicine

## 2011-05-04 ENCOUNTER — Ambulatory Visit: Payer: PRIVATE HEALTH INSURANCE | Admitting: Internal Medicine

## 2011-05-04 ENCOUNTER — Ambulatory Visit (INDEPENDENT_AMBULATORY_CARE_PROVIDER_SITE_OTHER): Payer: PRIVATE HEALTH INSURANCE | Admitting: Family Medicine

## 2011-05-04 DIAGNOSIS — J329 Chronic sinusitis, unspecified: Secondary | ICD-10-CM

## 2011-05-04 DIAGNOSIS — J40 Bronchitis, not specified as acute or chronic: Secondary | ICD-10-CM

## 2011-05-04 MED ORDER — CEFUROXIME AXETIL 500 MG PO TABS: 500.0000 mg | ORAL_TABLET | Freq: Two times a day (BID) | ORAL | Status: AC

## 2011-05-04 MED ORDER — PREDNISONE 10 MG PO TABS: 10.0000 mg | ORAL_TABLET | Freq: Every day | ORAL | Status: AC

## 2011-05-04 MED ORDER — ALBUTEROL SULFATE (5 MG/ML) 0.5% IN NEBU
2.5000 mg | INHALATION_SOLUTION | Freq: Once | RESPIRATORY_TRACT | Status: AC
  Administered 2011-05-04: 2.5 mg via RESPIRATORY_TRACT

## 2011-05-04 MED ORDER — ALBUTEROL SULFATE HFA 108 (90 BASE) MCG/ACT IN AERS: 2.0000 | INHALATION_SPRAY | Freq: Four times a day (QID) | RESPIRATORY_TRACT | Status: AC | PRN

## 2011-05-04 NOTE — Progress Notes (Signed)
Addended by: Arnette Norris on: 05/04/2011 12:15 PM   Modules accepted: Orders

## 2011-05-04 NOTE — Telephone Encounter (Signed)
Ok to give note 

## 2011-05-04 NOTE — Telephone Encounter (Signed)
Pt advise note can be given for today but to extend back until Monday will need to be approved by Dr Laury Axon who has gone for today. Pt aware will not received response until tomorrow. Please advise on work note.

## 2011-05-04 NOTE — Patient Instructions (Signed)
Bronchitis Bronchitis is the body's way of reacting to injury and/or infection (inflammation) of the bronchi. Bronchi are the air tubes that extend from the windpipe into the lungs. If the inflammation becomes severe, it may cause shortness of breath.  CAUSES Inflammation may be caused by:  A virus.   Germs (bacteria).   Dust.   Allergens.   Pollutants and many other irritants.  The cells lining the bronchial tree are covered with tiny hairs (cilia). These constantly beat upward, away from the lungs, toward the mouth. This keeps the lungs free of pollutants. When these cells become too irritated and are unable to do their job, mucus begins to develop. This causes the characteristic cough of bronchitis. The cough clears the lungs when the cilia are unable to do their job. Without either of these protective mechanisms, the mucus would settle in the lungs. Then you would develop pneumonia. Smoking is a common cause of bronchitis and can contribute to pneumonia. Stopping this habit is the single most important thing you can do to help yourself. TREATMENT  Your caregiver may prescribe an antibiotic if the cough is caused by bacteria. Also, medicines that open up your airways make it easier to breathe. Your caregiver may also recommend or prescribe an expectorant. It will loosen the mucus to be coughed up. Only take over-the-counter or prescription medicines for pain, discomfort, or fever as directed by your caregiver.   Removing whatever causes the problem (smoking, for example) is critical to preventing the problem from getting worse.   Cough suppressants may be prescribed for relief of cough symptoms.   Inhaled medicines may be prescribed to help with symptoms now and to help prevent problems from returning.   For those with recurrent (chronic) bronchitis, there may be a need for steroid medicines.  SEEK IMMEDIATE MEDICAL CARE IF:  During treatment, you develop more pus-like mucus  (purulent sputum).   You or your child has an oral temperature above 100.4, not controlled by medicine.   Your baby is older than 3 months with a rectal temperature of 102 F (38.9 C) or higher.   Your baby is 3 months old or younger with a rectal temperature of 100.4 F (38 C) or higher.   You become progressively more ill.   You have increased difficulty breathing, wheezing, or shortness of breath.  It is necessary to seek immediate medical care if you are elderly or sick from any other disease. MAKE SURE YOU:  Understand these instructions.   Will watch your condition.   Will get help right away if you are not doing well or get worse.  Document Released: 08/01/2005 Document Re-Released: 10/26/2009 [employer] Patient Information 2011 [employer]. 

## 2011-05-04 NOTE — Progress Notes (Signed)
  Subjective:     Casey Carrillo is a 55 y.o. female here for evaluation of a cough. Onset of symptoms was 4 days ago. Symptoms have been gradually worsening since that time. The cough is productive and is aggravated by exercise and reclining position. Associated symptoms include: chills, fever, shortness of breath, sputum production and wheezing. Patient does not have a history of asthma. Patient does not have a history of environmental allergens. Patient has not traveled recently. Patient does not have a history of smoking. Patient has not had a previous chest x-ray. Patient has not had a PPD done.  The following portions of the patient's history were reviewed and updated as appropriate: allergies, current medications, past family history, past medical history, past social history, past surgical history and problem list.  Review of Systems Pertinent items are noted in HPI.    Objective:    Oxygen saturation 97% on room air BP 144/92  Pulse 117  Temp(Src) 99 F (37.2 C) (Oral)  Wt 190 lb (86.183 kg)  SpO2 96% General appearance: alert, cooperative, appears stated age and no distress Head: Normocephalic, without obvious abnormality, atraumatic Ears: normal TM's and external ear canals both ears Nose: Nares normal. Septum midline. Mucosa normal. No drainage or sinus tenderness., green discharge, moderate congestion, sinus tenderness bilateral Neck: no adenopathy, supple, symmetrical, trachea midline and thyroid not enlarged, symmetric, no tenderness/mass/nodules Lungs: diminished breath sounds bilaterally and wheezes bilaterally Heart: S1, S2 normal Lymph nodes: Cervical, supraclavicular, and axillary nodes normal.    Assessment:    Acute Bronchitis and Sinusitis    Plan:    Antibiotics per medication orders. Antitussives per medication orders. Avoid exposure to tobacco smoke and fumes. Call if shortness of breath worsens, blood in sputum, change in character of cough,  development of fever or chills, inability to maintain nutrition and hydration. Avoid exposure to tobacco smoke and fumes. pred taper

## 2011-05-05 NOTE — Telephone Encounter (Signed)
Mssg left to make patient aware the letter was completed    KP

## 2011-06-06 ENCOUNTER — Telehealth: Payer: Self-pay | Admitting: Family Medicine

## 2011-06-06 NOTE — Telephone Encounter (Signed)
The pt called in and stated she was needing a refill of the bronchitis meds that she was given.  She does not remember the name.

## 2011-06-06 NOTE — Telephone Encounter (Signed)
msg left to verify medication.    KP

## 2011-06-07 ENCOUNTER — Ambulatory Visit (INDEPENDENT_AMBULATORY_CARE_PROVIDER_SITE_OTHER): Payer: PRIVATE HEALTH INSURANCE | Admitting: Family Medicine

## 2011-06-07 ENCOUNTER — Ambulatory Visit (HOSPITAL_BASED_OUTPATIENT_CLINIC_OR_DEPARTMENT_OTHER)
Admission: RE | Admit: 2011-06-07 | Discharge: 2011-06-07 | Disposition: A | Discharge status: Home / Self Care | Payer: PRIVATE HEALTH INSURANCE | Source: Ambulatory Visit | Attending: Family Medicine | Admitting: Family Medicine

## 2011-06-07 ENCOUNTER — Encounter: Payer: Self-pay | Admitting: Family Medicine

## 2011-06-07 VITALS — BP 132/86 | HR 81 | Temp 98.0°F | Wt 194.0 lb

## 2011-06-07 DIAGNOSIS — J40 Bronchitis, not specified as acute or chronic: Secondary | ICD-10-CM

## 2011-06-07 DIAGNOSIS — I517 Cardiomegaly: Secondary | ICD-10-CM | POA: Insufficient documentation

## 2011-06-07 DIAGNOSIS — R05 Cough: Secondary | ICD-10-CM

## 2011-06-07 DIAGNOSIS — R0989 Other specified symptoms and signs involving the circulatory and respiratory systems: Secondary | ICD-10-CM

## 2011-06-07 MED ORDER — PREDNISONE 10 MG PO TABS: ORAL_TABLET | ORAL | Status: DC

## 2011-06-07 MED ORDER — ALBUTEROL SULFATE (5 MG/ML) 0.5% IN NEBU
2.5000 mg | INHALATION_SOLUTION | Freq: Once | RESPIRATORY_TRACT | Status: AC
  Administered 2011-06-07: 2.5 mg via RESPIRATORY_TRACT

## 2011-06-07 MED ORDER — METHYLPREDNISOLONE ACETATE 80 MG/ML IJ SUSP
80.0000 mg | Freq: Once | INTRAMUSCULAR | Status: AC
  Administered 2011-06-07: 80 mg via INTRAMUSCULAR

## 2011-06-07 MED ORDER — MOXIFLOXACIN HCL 400 MG PO TABS: 400.0000 mg | ORAL_TABLET | Freq: Every day | ORAL | Status: AC

## 2011-06-07 NOTE — Patient Instructions (Signed)

## 2011-06-07 NOTE — Progress Notes (Signed)
  Subjective:     Casey Carrillo is a 55 y.o. female here for evaluation of a cough. Onset of symptoms was 4 weeks ago. Symptoms have been unchanged since that time. The cough is productive of brown sputum and is aggravated by  talking. Associated symptoms include: shortness of breath and sputum production. Patient does not have a history of asthma. Patient does have a history of environmental allergens. Patient has not traveled recently. Patient does have a history of smoking. Patient has not had a previous chest x-ray. Patient has not had a PPD done.  The following portions of the patient's history were reviewed and updated as appropriate: allergies, current medications, past family history, past medical history, past social history, past surgical history and problem list.  Review of Systems Pertinent items are noted in HPI.    Objective:    Oxygen saturation 99% on room air BP 132/86  Pulse 81  Temp(Src) 98 F (36.7 C) (Oral)  Wt 194 lb (87.998 kg)  SpO2 99% General appearance: alert, cooperative, appears stated age and no distress Ears: normal TM's and external ear canals both ears Nose: Nares normal. Septum midline. Mucosa normal. No drainage or sinus tenderness. Throat: lips, mucosa, and tongue normal; teeth and gums normal Neck: no adenopathy, no carotid bruit, no JVD, supple, symmetrical, trachea midline and thyroid not enlarged, symmetric, no tenderness/mass/nodules Lungs: diminished breath sounds bilaterally and wheezes bilaterally Heart: regular rate and rhythm, S1, S2 normal, no murmur, click, rub or gallop Extremities: extremities normal, atraumatic, no cyanosis or edema    Assessment:    Acute Bronchitis    Plan:    Antibiotics per medication orders. Antitussives per medication orders. Avoid exposure to tobacco smoke and fumes. B-agonist inhaler. Call if shortness of breath worsens, blood in sputum, change in character of cough, development of fever or chills,  inability to maintain nutrition and hydration. Avoid exposure to tobacco smoke and fumes.

## 2011-06-07 NOTE — Telephone Encounter (Signed)
Discussed with patient and she stated she was calling for an appt because she thinks er Bronchitis came back.    KP

## 2011-07-06 ENCOUNTER — Ambulatory Visit (INDEPENDENT_AMBULATORY_CARE_PROVIDER_SITE_OTHER): Payer: PRIVATE HEALTH INSURANCE

## 2011-07-06 DIAGNOSIS — Z23 Encounter for immunization: Secondary | ICD-10-CM

## 2011-08-31 IMAGING — CT CT ABD-PELV W/O CM
2 of 4 series · 16 of 46 positions shown, 18 images · non-contrast
Comparison: None.

CLINICAL DATA: Right low back pain and flank pain

CT ABDOMEN AND PELVIS WITHOUT CONTRAST
TECHNIQUE: Multidetector CT imaging of the abdomen and pelvis was
performed following the standard protocol without intravenous
contrast.

[Series 2: renal stone > 200 lbs 5.0 b31f · axial · 0.70mm/px · z∈[+752,+1142]mm · 13 of 86 slices shown, 15 images]
[im 4/86  soft-tissue]
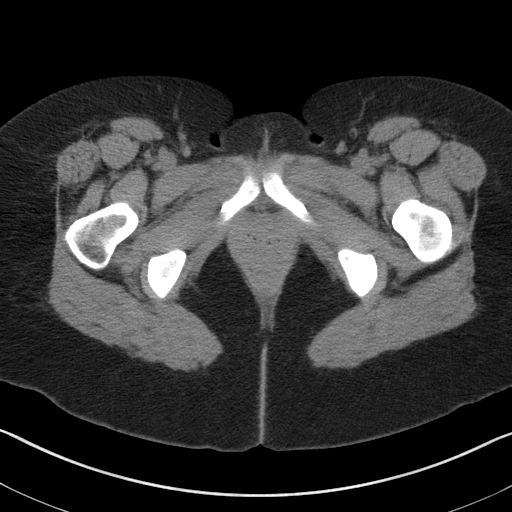
[im 4/86  bone]
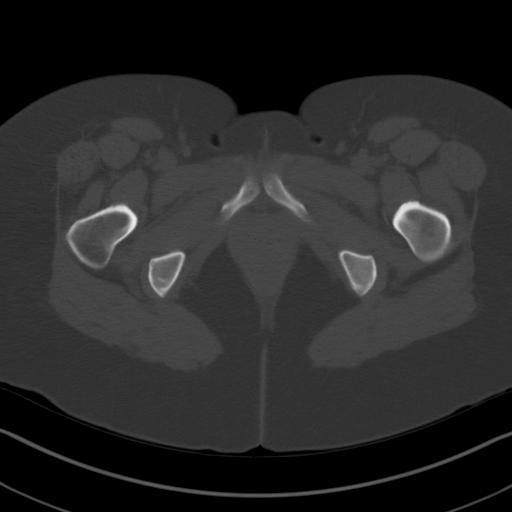
[im 12/86  soft-tissue]
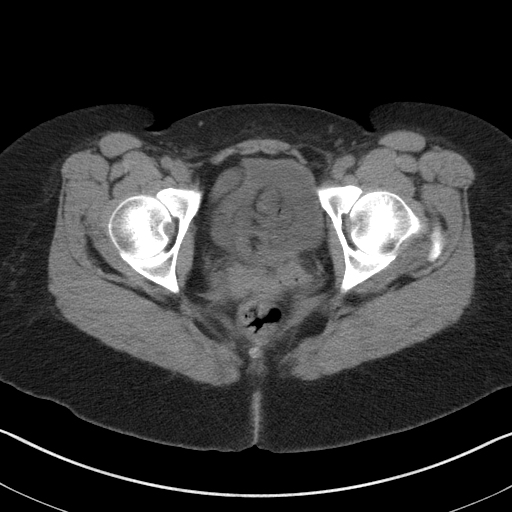
[im 19/86  soft-tissue]
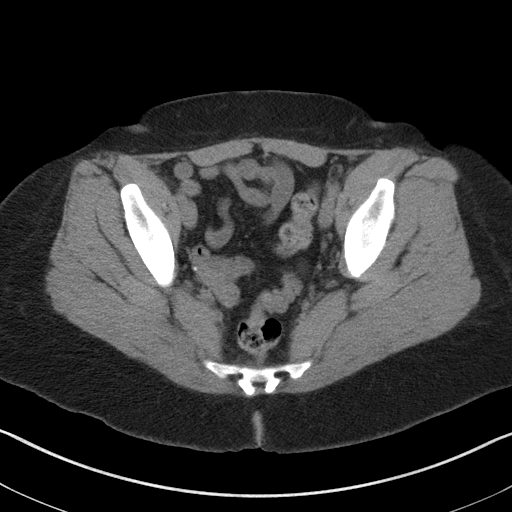
[im 23/86  soft-tissue]
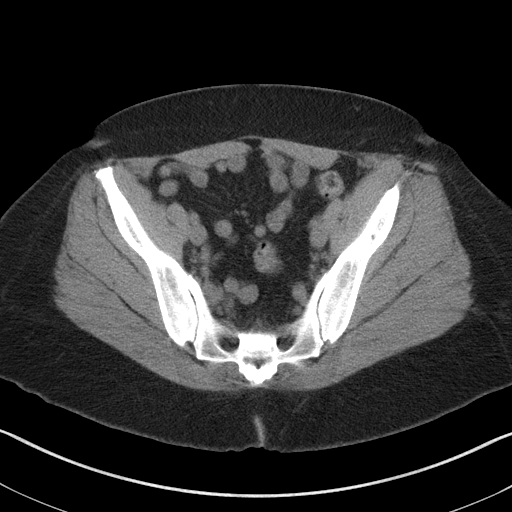
[im 30/86  soft-tissue]
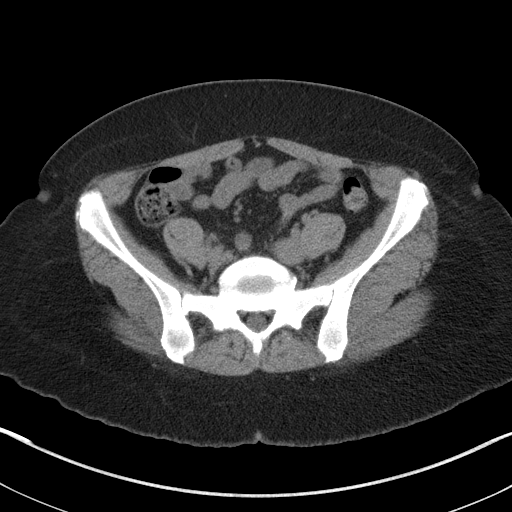
[im 37/86  soft-tissue]
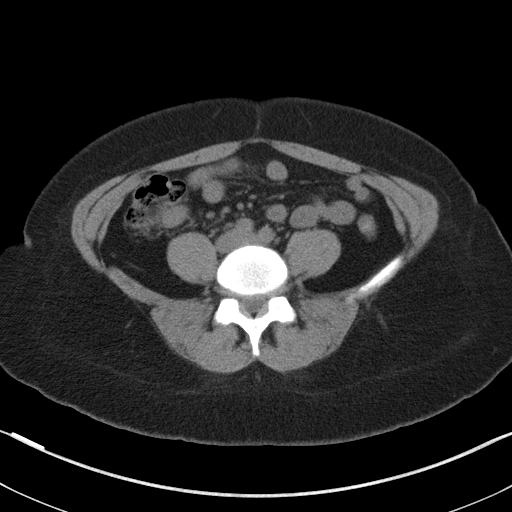
[im 45/86  soft-tissue]
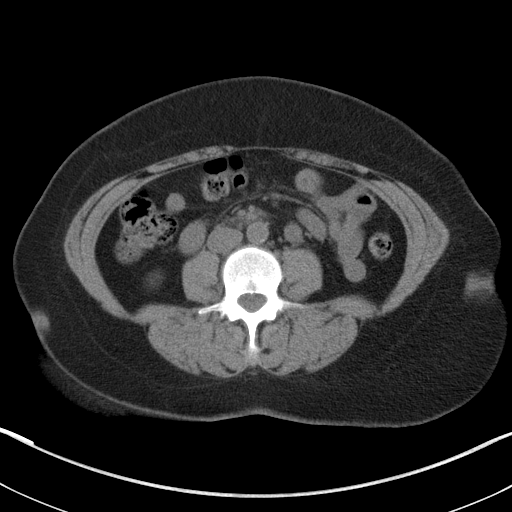
[im 49/86  soft-tissue]
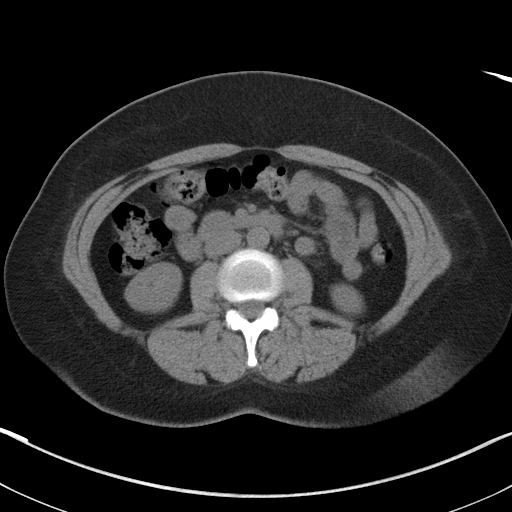
[im 56/86  soft-tissue]
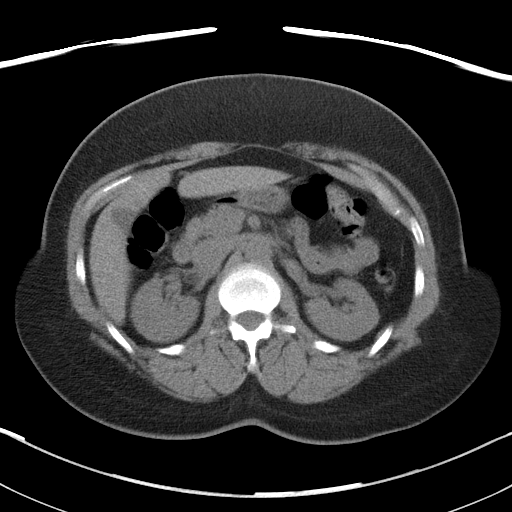
[im 56/86  bone]
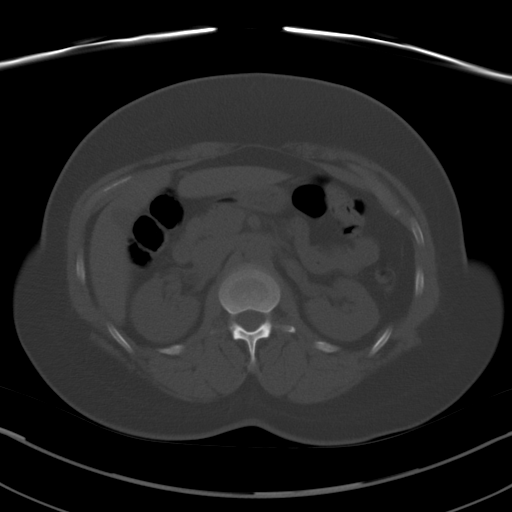
[im 63/86  soft-tissue]
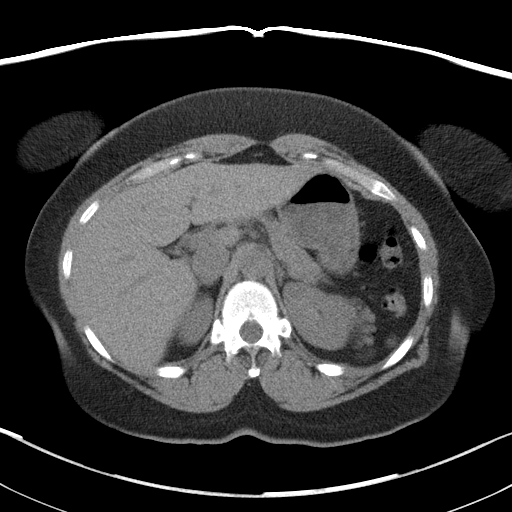
[im 67/86  soft-tissue]
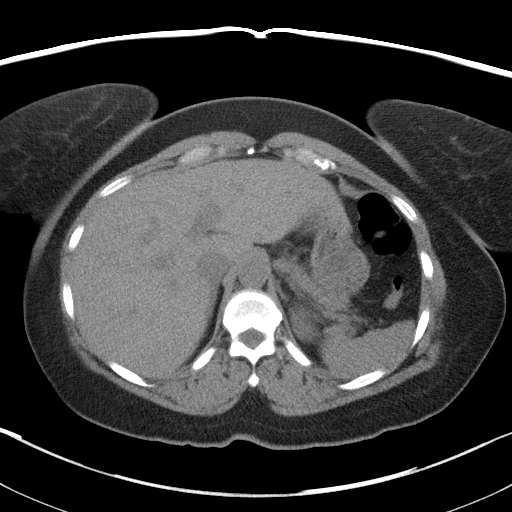
[im 74/86  soft-tissue]
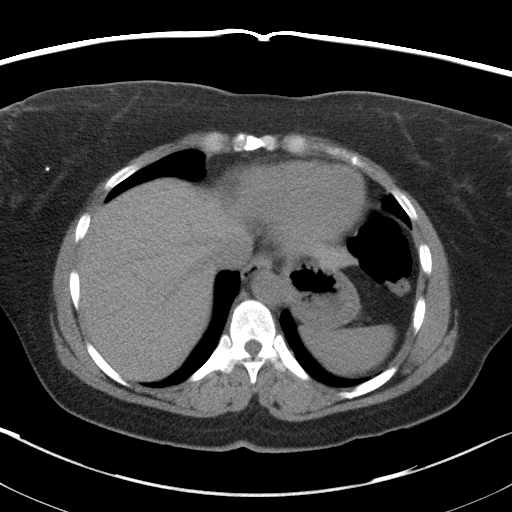
[im 82/86  soft-tissue]
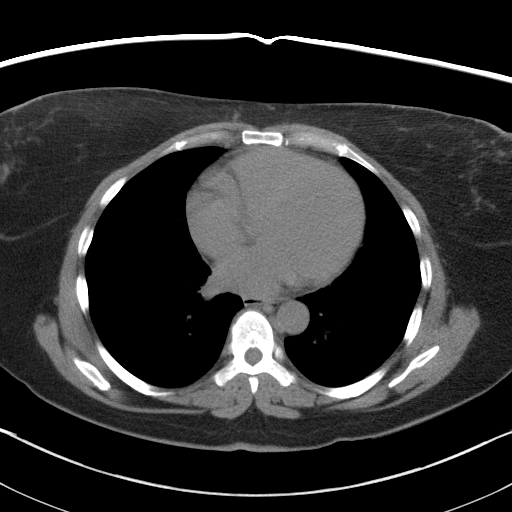

[Series 5: renal stone 3.0 coronal · coronal · 0.74mm/px · 3 of 86 slices shown]
[im 29/86  soft-tissue]
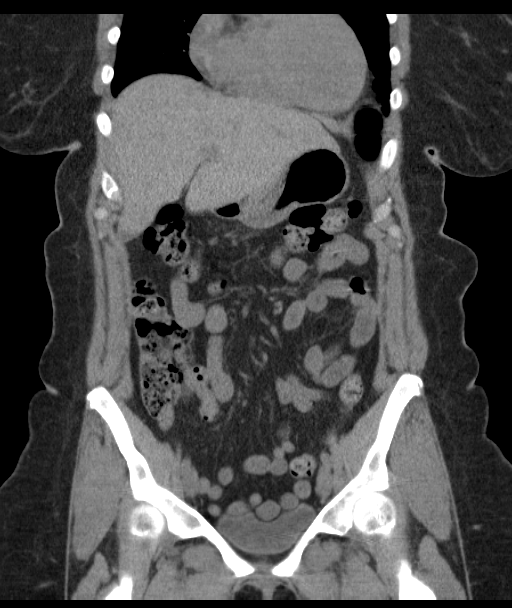
[im 38/86  soft-tissue]
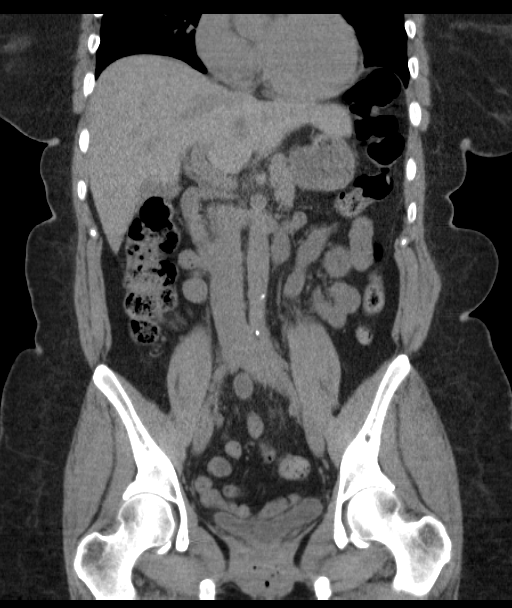
[im 48/86  soft-tissue]
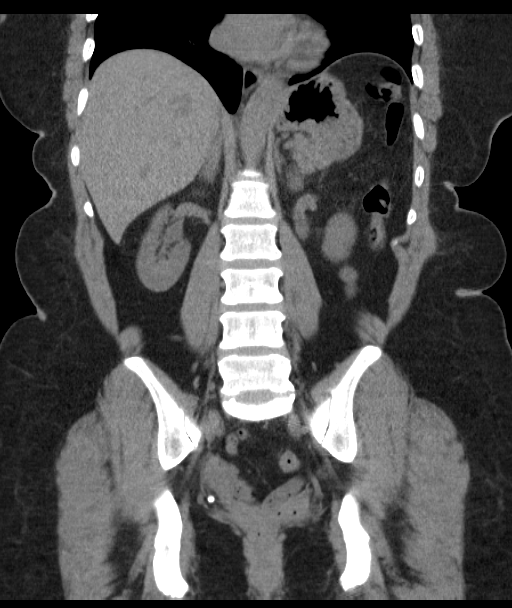

[16 of 46 positions shown; findings below may reference images not displayed]

FINDINGS: Dependent atelectasis is seen at the lung bases.  The
heart is normal in size without pericardial effusion.  The
unenhanced appearance of the spleen, adrenal glands, gallbladder
and pancreas are within normal limits.  Incidental note is made of
focal area of fat in the mid body of the pancreas, benign in
appearance.  A round 1.7 cm hypoattenuating lesion is seen in the
post right hepatic lobe, incompletely characterized without IV
contrast.  No renal calculi are seen.  There is no evidence of
hydronephrosis or perinephric stranding.  The ureters are normal.
No stones are seen in the urinary bladder.  There is no small bowel
obstruction.  Normal appendix is seen in the right lower quadrant.
No inflammatory changes are seen in the colon.

There is no lymphadenopathy or free fluid seen in the abdomen or
pelvis.  The patient has had a hysterectomy.  No adnexal masses
seen.
IMPRESSION: 1.  No renal calculi are seen on today's exam.  There is no
hydronephrosis.
2.  Incompletely characterized hypoattenuating lesion in the
posterior hepatic lobe.  An ultrasound can be of use for further
evaluation.

## 2011-09-30 ENCOUNTER — Ambulatory Visit (INDEPENDENT_AMBULATORY_CARE_PROVIDER_SITE_OTHER): Payer: PRIVATE HEALTH INSURANCE | Admitting: Family Medicine

## 2011-09-30 ENCOUNTER — Encounter: Payer: Self-pay | Admitting: Family Medicine

## 2011-09-30 VITALS — BP 136/82 | HR 88 | Temp 98.3°F | Wt 192.0 lb

## 2011-09-30 DIAGNOSIS — H00019 Hordeolum externum unspecified eye, unspecified eyelid: Secondary | ICD-10-CM

## 2011-09-30 NOTE — Progress Notes (Signed)
  Subjective:    Patient ID: Casey Carrillo, female    DOB: 1956-07-25, 56 y.o.   MRN: [id number]  HPI Pt c/o of stye in R eye, upper lid that has been there for a while and is only getting bigger.  She used warm compresses regularly with no relief.       Review of Systems    as above Objective:   Physical Exam  Constitutional: She is oriented to person, place, and time. She appears well-developed and well-nourished.  Eyes:       + large stye R upper lid,  No drainage tender  Neurological: She is alert and oriented to person, place, and time.  Psychiatric: She has a normal mood and affect. Her behavior is normal.          Assessment & Plan:  1.  Stye--- con't warm compresses and refer to opth for further evaluation

## 2011-11-18 ENCOUNTER — Other Ambulatory Visit (HOSPITAL_COMMUNITY)
Admission: RE | Admit: 2011-11-18 | Discharge: 2011-11-18 | Disposition: A | Discharge status: Home / Self Care | Payer: PRIVATE HEALTH INSURANCE | Source: Ambulatory Visit | Attending: Family Medicine | Admitting: Family Medicine

## 2011-11-18 ENCOUNTER — Ambulatory Visit (INDEPENDENT_AMBULATORY_CARE_PROVIDER_SITE_OTHER): Payer: PRIVATE HEALTH INSURANCE | Admitting: Family Medicine

## 2011-11-18 ENCOUNTER — Encounter: Payer: Self-pay | Admitting: Family Medicine

## 2011-11-18 VITALS — BP 124/82 | HR 85 | Temp 97.6°F | Ht 64.0 in | Wt 189.8 lb

## 2011-11-18 DIAGNOSIS — R319 Hematuria, unspecified: Secondary | ICD-10-CM

## 2011-11-18 DIAGNOSIS — Z1239 Encounter for other screening for malignant neoplasm of breast: Secondary | ICD-10-CM

## 2011-11-18 DIAGNOSIS — Z780 Asymptomatic menopausal state: Secondary | ICD-10-CM

## 2011-11-18 DIAGNOSIS — Z124 Encounter for screening for malignant neoplasm of cervix: Secondary | ICD-10-CM

## 2011-11-18 DIAGNOSIS — Z01419 Encounter for gynecological examination (general) (routine) without abnormal findings: Secondary | ICD-10-CM | POA: Insufficient documentation

## 2011-11-18 DIAGNOSIS — Z0000 Encounter for general adult medical examination without abnormal findings: Secondary | ICD-10-CM

## 2011-11-18 LAB — BASIC METABOLIC PANEL
BUN: 16 mg/dL (ref 6–23)
Calcium: 9.3 mg/dL (ref 8.4–10.5)
Chloride: 108 mEq/L (ref 96–112)
Creatinine, Ser: 0.9 mg/dL (ref 0.4–1.2)
GFR: 88.67 mL/min (ref >60.00)

## 2011-11-18 LAB — CBC WITH DIFFERENTIAL/PLATELET
Basophils Relative: 1 % (ref 0.0–3.0)
Eosinophils Relative: 1.1 % (ref 0.0–5.0)
Lymphocytes Relative: 38.9 % (ref 12.0–46.0)
Monocytes Relative: 6.5 % (ref 3.0–12.0)
Neutrophils Relative %: 52.5 % (ref 43.0–77.0)
Platelets: 398 10*3/uL (ref 150.0–400.0)
RBC: 3.97 Mil/uL (ref 3.87–5.11)
WBC: 4.5 10*3/uL (ref 4.5–10.5)

## 2011-11-18 LAB — HEPATIC FUNCTION PANEL
ALT: 13 U/L (ref 0–35)
AST: 14 U/L (ref 0–37)
Total Protein: 7.2 g/dL (ref 6.0–8.3)

## 2011-11-18 LAB — POCT URINALYSIS DIPSTICK
Bilirubin, UA: NEGATIVE
Glucose, UA: NEGATIVE
Nitrite, UA: NEGATIVE
Spec Grav, UA: 1.01
Urobilinogen, UA: 0.2

## 2011-11-18 LAB — TSH: TSH: 0.59 u[IU]/mL (ref 0.35–5.50)

## 2011-11-18 LAB — LIPID PANEL
Cholesterol: 191 mg/dL (ref 0–200)
LDL Cholesterol: 128 mg/dL — ABNORMAL HIGH (ref 0–99)
Triglycerides: 60 mg/dL (ref 0.0–149.0)

## 2011-11-18 MED ORDER — ESOMEPRAZOLE MAGNESIUM 40 MG PO CPDR: 40.0000 mg | DELAYED_RELEASE_CAPSULE | Freq: Every day | ORAL | Status: AC

## 2011-11-18 NOTE — Patient Instructions (Signed)
Preventive Care for Adults, Female A healthy lifestyle and preventive care can promote health and wellness. Preventive health guidelines for women include the following key practices.  A routine yearly physical is a good way to check with your caregiver about your health and preventive screening. It is a chance to share any concerns and updates on your health, and to receive a thorough exam.   Visit your dentist for a routine exam and preventive care every 6 months. Brush your teeth twice a day and floss once a day. Good oral hygiene prevents tooth decay and gum disease.   The frequency of eye exams is based on your age, health, family medical history, use of contact lenses, and other factors. Follow your caregiver's recommendations for frequency of eye exams.   Eat a healthy diet. Foods like vegetables, fruits, whole grains, low-fat dairy products, and lean protein foods contain the nutrients you need without too many calories. Decrease your intake of foods high in solid fats, added sugars, and salt. Eat the right amount of calories for you.Get information about a proper diet from your caregiver, if necessary.   Regular physical exercise is one of the most important things you can do for your health. Most adults should get at least 150 minutes of moderate-intensity exercise (any activity that increases your heart rate and causes you to sweat) each week. In addition, most adults need muscle-strengthening exercises on 2 or more days a week.   Maintain a healthy weight. The body mass index (BMI) is a screening tool to identify possible weight problems. It provides an estimate of body fat based on height and weight. Your caregiver can help determine your BMI, and can help you achieve or maintain a healthy weight.For adults 20 years and older:   A BMI below 18.5 is considered underweight.   A BMI of 18.5 to 24.9 is normal.   A BMI of 25 to 29.9 is considered overweight.   A BMI of 30 and above is  considered obese.   Maintain normal blood lipids and cholesterol levels by exercising and minimizing your intake of saturated fat. Eat a balanced diet with plenty of fruit and vegetables. Blood tests for lipids and cholesterol should begin at age 20 and be repeated every 5 years. If your lipid or cholesterol levels are high, you are over 50, or you are at high risk for heart disease, you may need your cholesterol levels checked more frequently.Ongoing high lipid and cholesterol levels should be treated with medicines if diet and exercise are not effective.   If you smoke, find out from your caregiver how to quit. If you do not use tobacco, do not start.   If you are pregnant, do not drink alcohol. If you are breastfeeding, be very cautious about drinking alcohol. If you are not pregnant and choose to drink alcohol, do not exceed 1 drink per day. One drink is considered to be 12 ounces (355 mL) of beer, 5 ounces (148 mL) of wine, or 1.5 ounces (44 mL) of liquor.   Avoid use of street drugs. Do not share needles with anyone. Ask for help if you need support or instructions about stopping the use of drugs.   High blood pressure causes heart disease and increases the risk of stroke. Your blood pressure should be checked at least every 1 to 2 years. Ongoing high blood pressure should be treated with medicines if weight loss and exercise are not effective.   If you are 55 to 57   years old, ask your caregiver if you should take aspirin to prevent strokes.   Diabetes screening involves taking a blood sample to check your fasting blood sugar level. This should be done once every 3 years, after age 45, if you are within normal weight and without risk factors for diabetes. Testing should be considered at a younger age or be carried out more frequently if you are overweight and have at least 1 risk factor for diabetes.   Breast cancer screening is essential preventive care for women. You should practice "breast  self-awareness." This means understanding the normal appearance and feel of your breasts and may include breast self-examination. Any changes detected, no matter how small, should be reported to a caregiver. Women in their 20s and 30s should have a clinical breast exam (CBE) by a caregiver as part of a regular health exam every 1 to 3 years. After age 40, women should have a CBE every year. Starting at age 40, women should consider having a mammography (breast X-ray test) every year. Women who have a family history of breast cancer should talk to their caregiver about genetic screening. Women at a high risk of breast cancer should talk to their caregivers about having magnetic resonance imaging (MRI) and a mammography every year.   The Pap test is a screening test for cervical cancer. A Pap test can show cell changes on the cervix that might become cervical cancer if left untreated. A Pap test is a procedure in which cells are obtained and examined from the lower end of the uterus (cervix).   Women should have a Pap test starting at age 21.   Between ages 21 and 29, Pap tests should be repeated every 2 years.   Beginning at age 30, you should have a Pap test every 3 years as long as the past 3 Pap tests have been normal.   Some women have medical problems that increase the chance of getting cervical cancer. Talk to your caregiver about these problems. It is especially important to talk to your caregiver if a new problem develops soon after your last Pap test. In these cases, your caregiver may recommend more frequent screening and Pap tests.   The above recommendations are the same for women who have or have not gotten the vaccine for human papillomavirus (HPV).   If you had a hysterectomy for a problem that was not cancer or a condition that could lead to cancer, then you no longer need Pap tests. Even if you no longer need a Pap test, a regular exam is a good idea to make sure no other problems are  starting.   If you are between ages 65 and 70, and you have had normal Pap tests going back 10 years, you no longer need Pap tests. Even if you no longer need a Pap test, a regular exam is a good idea to make sure no other problems are starting.   If you have had past treatment for cervical cancer or a condition that could lead to cancer, you need Pap tests and screening for cancer for at least 20 years after your treatment.   If Pap tests have been discontinued, risk factors (such as a new sexual partner) need to be reassessed to determine if screening should be resumed.   The HPV test is an additional test that may be used for cervical cancer screening. The HPV test looks for the virus that can cause the cell changes on the cervix.   The cells collected during the Pap test can be tested for HPV. The HPV test could be used to screen women aged 30 years and older, and should be used in women of any age who have unclear Pap test results. After the age of 30, women should have HPV testing at the same frequency as a Pap test.   Colorectal cancer can be detected and often prevented. Most routine colorectal cancer screening begins at the age of 50 and continues through age 75. However, your caregiver may recommend screening at an earlier age if you have risk factors for colon cancer. On a yearly basis, your caregiver may provide home test kits to check for hidden blood in the stool. Use of a small camera at the end of a tube, to directly examine the colon (sigmoidoscopy or colonoscopy), can detect the earliest forms of colorectal cancer. Talk to your caregiver about this at age 50, when routine screening begins. Direct examination of the colon should be repeated every 5 to 10 years through age 75, unless early forms of pre-cancerous polyps or small growths are found.   Hepatitis C blood testing is recommended for all people born from 1945 through 1965 and any individual with known risks for hepatitis C.    Practice safe sex. Use condoms and avoid high-risk sexual practices to reduce the spread of sexually transmitted infections (STIs). STIs include gonorrhea, chlamydia, syphilis, trichomonas, herpes, HPV, and human immunodeficiency virus (HIV). Herpes, HIV, and HPV are viral illnesses that have no cure. They can result in disability, cancer, and death. Sexually active women aged 25 and younger should be checked for chlamydia. Older women with new or multiple partners should also be tested for chlamydia. Testing for other STIs is recommended if you are sexually active and at increased risk.   Osteoporosis is a disease in which the bones lose minerals and strength with aging. This can result in serious bone fractures. The risk of osteoporosis can be identified using a bone density scan. Women ages 65 and over and women at risk for fractures or osteoporosis should discuss screening with their caregivers. Ask your caregiver whether you should take a calcium supplement or vitamin D to reduce the rate of osteoporosis.   Menopause can be associated with physical symptoms and risks. Hormone replacement therapy is available to decrease symptoms and risks. You should talk to your caregiver about whether hormone replacement therapy is right for you.   Use sunscreen with sun protection factor (SPF) of 30 or more. Apply sunscreen liberally and repeatedly throughout the day. You should seek shade when your shadow is shorter than you. Protect yourself by wearing long sleeves, pants, a wide-brimmed hat, and sunglasses year round, whenever you are outdoors.   Once a month, do a whole body skin exam, using a mirror to look at the skin on your back. Notify your caregiver of new moles, moles that have irregular borders, moles that are larger than a pencil eraser, or moles that have changed in shape or color.   Stay current with required immunizations.   Influenza. You need a dose every fall (or winter). The composition of  the flu vaccine changes each year, so being vaccinated once is not enough.   Pneumococcal polysaccharide. You need 1 to 2 doses if you smoke cigarettes or if you have certain chronic medical conditions. You need 1 dose at age 65 (or older) if you have never been vaccinated.   Tetanus, diphtheria, pertussis (Tdap, Td). Get 1 dose of   Tdap vaccine if you are younger than age 65, are over 65 and have contact with an infant, are a healthcare worker, are pregnant, or simply want to be protected from whooping cough. After that, you need a Td booster dose every 10 years. Consult your caregiver if you have not had at least 3 tetanus and diphtheria-containing shots sometime in your life or have a deep or dirty wound.   HPV. You need this vaccine if you are a woman age 26 or younger. The vaccine is given in 3 doses over 6 months.   Measles, mumps, rubella (MMR). You need at least 1 dose of MMR if you were born in 1957 or later. You may also need a second dose.   Meningococcal. If you are age 19 to 21 and a first-year college student living in a residence hall, or have one of several medical conditions, you need to get vaccinated against meningococcal disease. You may also need additional booster doses.   Zoster (shingles). If you are age 60 or older, you should get this vaccine.   Varicella (chickenpox). If you have never had chickenpox or you were vaccinated but received only 1 dose, talk to your caregiver to find out if you need this vaccine.   Hepatitis A. You need this vaccine if you have a specific risk factor for hepatitis A virus infection or you simply wish to be protected from this disease. The vaccine is usually given as 2 doses, 6 to 18 months apart.   Hepatitis B. You need this vaccine if you have a specific risk factor for hepatitis B virus infection or you simply wish to be protected from this disease. The vaccine is given in 3 doses, usually over 6 months.  Preventive Services /  Frequency Ages 19 to 39  Blood pressure check.** / Every 1 to 2 years.   Lipid and cholesterol check.** / Every 5 years beginning at age 20.   Clinical breast exam.** / Every 3 years for women in their 20s and 30s.   Pap test.** / Every 2 years from ages 21 through 29. Every 3 years starting at age 30 through age 65 or 70 with a history of 3 consecutive normal Pap tests.   HPV screening.** / Every 3 years from ages 30 through ages 65 to 70 with a history of 3 consecutive normal Pap tests.   Hepatitis C blood test.** / For any individual with known risks for hepatitis C.   Skin self-exam. / Monthly.   Influenza immunization.** / Every year.   Pneumococcal polysaccharide immunization.** / 1 to 2 doses if you smoke cigarettes or if you have certain chronic medical conditions.   Tetanus, diphtheria, pertussis (Tdap, Td) immunization. / A one-time dose of Tdap vaccine. After that, you need a Td booster dose every 10 years.   HPV immunization. / 3 doses over 6 months, if you are 26 and younger.   Measles, mumps, rubella (MMR) immunization. / You need at least 1 dose of MMR if you were born in 1957 or later. You may also need a second dose.   Meningococcal immunization. / 1 dose if you are age 19 to 21 and a first-year college student living in a residence hall, or have one of several medical conditions, you need to get vaccinated against meningococcal disease. You may also need additional booster doses.   Varicella immunization.** / Consult your caregiver.   Hepatitis A immunization.** / Consult your caregiver. 2 doses, 6 to 18 months   apart.   Hepatitis B immunization.** / Consult your caregiver. 3 doses usually over 6 months.  Ages 40 to 64  Blood pressure check.** / Every 1 to 2 years.   Lipid and cholesterol check.** / Every 5 years beginning at age 20.   Clinical breast exam.** / Every year after age 40.   Mammogram.** / Every year beginning at age 40 and continuing for as  long as you are in good health. Consult with your caregiver.   Pap test.** / Every 3 years starting at age 30 through age 65 or 70 with a history of 3 consecutive normal Pap tests.   HPV screening.** / Every 3 years from ages 30 through ages 65 to 70 with a history of 3 consecutive normal Pap tests.   Fecal occult blood test (FOBT) of stool. / Every year beginning at age 50 and continuing until age 75. You may not need to do this test if you get a colonoscopy every 10 years.   Flexible sigmoidoscopy or colonoscopy.** / Every 5 years for a flexible sigmoidoscopy or every 10 years for a colonoscopy beginning at age 50 and continuing until age 75.   Hepatitis C blood test.** / For all people born from 1945 through 1965 and any individual with known risks for hepatitis C.   Skin self-exam. / Monthly.   Influenza immunization.** / Every year.   Pneumococcal polysaccharide immunization.** / 1 to 2 doses if you smoke cigarettes or if you have certain chronic medical conditions.   Tetanus, diphtheria, pertussis (Tdap, Td) immunization.** / A one-time dose of Tdap vaccine. After that, you need a Td booster dose every 10 years.   Measles, mumps, rubella (MMR) immunization. / You need at least 1 dose of MMR if you were born in 1957 or later. You may also need a second dose.   Varicella immunization.** / Consult your caregiver.   Meningococcal immunization.** / Consult your caregiver.   Hepatitis A immunization.** / Consult your caregiver. 2 doses, 6 to 18 months apart.   Hepatitis B immunization.** / Consult your caregiver. 3 doses, usually over 6 months.  Ages 65 and over  Blood pressure check.** / Every 1 to 2 years.   Lipid and cholesterol check.** / Every 5 years beginning at age 20.   Clinical breast exam.** / Every year after age 40.   Mammogram.** / Every year beginning at age 40 and continuing for as long as you are in good health. Consult with your caregiver.   Pap test.** /  Every 3 years starting at age 30 through age 65 or 70 with a 3 consecutive normal Pap tests. Testing can be stopped between 65 and 70 with 3 consecutive normal Pap tests and no abnormal Pap or HPV tests in the past 10 years.   HPV screening.** / Every 3 years from ages 30 through ages 65 or 70 with a history of 3 consecutive normal Pap tests. Testing can be stopped between 65 and 70 with 3 consecutive normal Pap tests and no abnormal Pap or HPV tests in the past 10 years.   Fecal occult blood test (FOBT) of stool. / Every year beginning at age 50 and continuing until age 75. You may not need to do this test if you get a colonoscopy every 10 years.   Flexible sigmoidoscopy or colonoscopy.** / Every 5 years for a flexible sigmoidoscopy or every 10 years for a colonoscopy beginning at age 50 and continuing until age 75.   Hepatitis   C blood test.** / For all people born from 1945 through 1965 and any individual with known risks for hepatitis C.   Osteoporosis screening.** / A one-time screening for women ages 65 and over and women at risk for fractures or osteoporosis.   Skin self-exam. / Monthly.   Influenza immunization.** / Every year.   Pneumococcal polysaccharide immunization.** / 1 dose at age 65 (or older) if you have never been vaccinated.   Tetanus, diphtheria, pertussis (Tdap, Td) immunization. / A one-time dose of Tdap vaccine if you are over 65 and have contact with an infant, are a healthcare worker, or simply want to be protected from whooping cough. After that, you need a Td booster dose every 10 years.   Varicella immunization.** / Consult your caregiver.   Meningococcal immunization.** / Consult your caregiver.   Hepatitis A immunization.** / Consult your caregiver. 2 doses, 6 to 18 months apart.   Hepatitis B immunization.** / Check with your caregiver. 3 doses, usually over 6 months.  ** Family history and personal history of risk and conditions may change your caregiver's  recommendations. Document Released: 09/27/2001 Document Revised: 07/21/2011 Document Reviewed: 12/27/2010 [employer] Patient Information 2012 [employer]. 

## 2011-11-18 NOTE — Progress Notes (Signed)
Subjective:     Casey Carrillo is a 56 y.o. female and is here for a comprehensive physical exam. The patient reports no problems.  History   Social History  . Marital Status: Married    Spouse Name: N/A    Number of Children: N/A  . Years of Education: N/A   Occupational History  . Contract management     [employer]   Social History Main Topics  . Smoking status: Never Smoker   . Smokeless tobacco: Not on file  . Alcohol Use: No  . Drug Use: No  . Sexually Active:    Other Topics Concern  . Not on file   Social History Narrative  . No narrative on file   Health Maintenance  Topic Date Due  . Influenza Vaccine  05/15/2012  . Mammogram  09/10/2012  . Pap Smear  11/18/2014  . Colonoscopy  02/16/2018  . Tetanus/tdap  07/06/2020    The following portions of the patient's history were reviewed and updated as appropriate: allergies, current medications, past family history, past medical history, past social history, past surgical history and problem list.  Review of Systems Review of Systems  Constitutional: Negative for activity change, appetite change and fatigue.  HENT: Negative for hearing loss, congestion, tinnitus and ear discharge.  dentist q35m Eyes: Negative for visual disturbance (see optho q1y -- vision corrected to 20/20 with glasses).  Respiratory: Negative for cough, chest tightness and shortness of breath.   Cardiovascular: Negative for chest pain, palpitations and leg swelling.  Gastrointestinal: Negative for abdominal pain, diarrhea, constipation and abdominal distention.  Genitourinary: Negative for urgency, frequency, decreased urine volume and difficulty urinating.  Musculoskeletal: Negative for back pain, arthralgias and gait problem.  Skin: Negative for color change, pallor and rash.  Neurological: Negative for dizziness, light-headedness, numbness and headaches.  Hematological: Negative for adenopathy. Does not bruise/bleed easily.    Psychiatric/Behavioral: Negative for suicidal ideas, confusion, sleep disturbance, self-injury, dysphoric mood, decreased concentration and agitation.       Objective:    BP 124/82  Pulse 85  Temp(Src) 97.6 F (36.4 C) (Oral)  Ht 5\' 4"  (1.626 m)  Wt 189 lb 12.8 oz (86.093 kg)  BMI 32.58 kg/m2  SpO2 99% General appearance: alert, cooperative, appears stated age and no distress Head: Normocephalic, without obvious abnormality, atraumatic Eyes: conjunctivae/corneas clear. PERRL, EOM's intact. Fundi benign. Ears: normal TM's and external ear canals both ears Nose: Nares normal. Septum midline. Mucosa normal. No drainage or sinus tenderness. Throat: lips, mucosa, and tongue normal; teeth and gums normal Neck: no adenopathy, no carotid bruit, no JVD, supple, symmetrical, trachea midline and thyroid not enlarged, symmetric, no tenderness/mass/nodules Back: symmetric, no curvature. ROM normal. No CVA tenderness. Lungs: clear to auscultation bilaterally Breasts: normal appearance, no masses or tenderness Heart: regular rate and rhythm, S1, S2 normal, no murmur, click, rub or gallop Abdomen: soft, non-tender; bowel sounds normal; no masses,  no organomegaly Pelvic: external genitalia normal, no adnexal masses or tenderness, no bladder tenderness, rectovaginal septum normal, uterus surgically absent and vagina normal without discharge Extremities: extremities normal, atraumatic, no cyanosis or edema Pulses: 2+ and symmetric Skin: Skin color, texture, turgor normal. No rashes or lesions Lymph nodes: Cervical, supraclavicular, and axillary nodes normal. Neurologic: Alert and oriented X 3, normal strength and tone. Normal symmetric reflexes. Normal coordination and gait psych--no depression or anxiety    Assessment:    Healthy female exam.  Hyperlipidemia Postmenopausal---check bmd, calcium , vita D  Plan:    check labs ghm utd Check mammo, bmd See After Visit Summary for  Counseling Recommendations

## 2011-11-18 NOTE — Progress Notes (Signed)
Addended by: Silvio Pate D on: 11/18/2011 11:59 AM   Modules accepted: Orders

## 2011-11-20 LAB — URINE CULTURE: Colony Count: 2000

## 2011-12-30 ENCOUNTER — Telehealth: Payer: Self-pay | Admitting: Family Medicine

## 2011-12-30 NOTE — Telephone Encounter (Signed)
msg left with husband.    KP 

## 2011-12-30 NOTE — Telephone Encounter (Signed)
Caller: Casey Carrillo/Patient; PCP: Lelon Perla.; CB#: [phone]; ; ; Call regarding Returning Call To [name]; per Epic, note in chart states [name] called to advise after triage 1330 12/30/11 that Dr. Laury Axon feels she can be seen in [facility] office 12/31/11 or may see Dr. Laury Axon in office 01/02/12.  Patient advised; offered appt at [facility].  Declines; states she will see how she's feeling 01/02/12 and call office for appt.

## 2011-12-30 NOTE — Telephone Encounter (Signed)
Caller: Casey Carrillo/Patient is calling about starting with Cough and Congestion for past week and low grade fever on and off =99-101.4 oral. This morning temp=99.1oral @ 0800. She is taking Daytime and Nighttime Comtrex and last took at 0900. Felt achy and started with headache last night and took Ibuprofen and went to bed early. Feels bettter today. She has had Bronchitis twice in past 6 months. Triage and Care advice per URI Protocol and advised to call back on 01/02/12 if sx not improving.

## 2011-12-30 NOTE — Telephone Encounter (Signed)
She can go to Saturday clinic if needed Otherwise I can see her monday

## 2012-02-24 ENCOUNTER — Other Ambulatory Visit: Payer: PRIVATE HEALTH INSURANCE

## 2012-02-24 ENCOUNTER — Ambulatory Visit: Payer: PRIVATE HEALTH INSURANCE

## 2012-02-28 ENCOUNTER — Ambulatory Visit
Admission: RE | Admit: 2012-02-28 | Discharge: 2012-02-28 | Disposition: A | Discharge status: Home / Self Care | Payer: PRIVATE HEALTH INSURANCE | Source: Ambulatory Visit | Attending: Family Medicine | Admitting: Family Medicine

## 2012-02-28 DIAGNOSIS — Z780 Asymptomatic menopausal state: Secondary | ICD-10-CM

## 2012-02-28 DIAGNOSIS — Z1239 Encounter for other screening for malignant neoplasm of breast: Secondary | ICD-10-CM

## 2012-03-15 ENCOUNTER — Encounter: Payer: Self-pay | Admitting: Family Medicine

## 2012-06-15 ENCOUNTER — Ambulatory Visit (INDEPENDENT_AMBULATORY_CARE_PROVIDER_SITE_OTHER): Payer: PRIVATE HEALTH INSURANCE | Admitting: Family Medicine

## 2012-06-15 ENCOUNTER — Encounter: Payer: Self-pay | Admitting: Family Medicine

## 2012-06-15 VITALS — BP 120/80 | HR 72 | Temp 98.2°F | Wt 193.6 lb

## 2012-06-15 DIAGNOSIS — K056 Periodontal disease, unspecified: Secondary | ICD-10-CM

## 2012-06-15 DIAGNOSIS — K069 Disorder of gingiva and edentulous alveolar ridge, unspecified: Secondary | ICD-10-CM

## 2012-06-15 DIAGNOSIS — K120 Recurrent oral aphthae: Secondary | ICD-10-CM

## 2012-06-15 DIAGNOSIS — L719 Rosacea, unspecified: Secondary | ICD-10-CM

## 2012-06-15 MED ORDER — AMOXICILLIN-POT CLAVULANATE 875-125 MG PO TABS: 1.0000 | ORAL_TABLET | Freq: Two times a day (BID) | ORAL | Status: AC

## 2012-06-15 MED ORDER — METRONIDAZOLE 0.75 % EX CREA: TOPICAL_CREAM | Freq: Two times a day (BID) | CUTANEOUS | Status: AC

## 2012-06-15 MED ORDER — FIRST-DUKES MOUTHWASH MT SUSP: OROMUCOSAL | Status: AC

## 2012-06-15 NOTE — Patient Instructions (Signed)
Teeth and Gum Care Most problems with teeth and gums can be prevented if you do the following:  Brush your teeth.  Brush after each meal or at least 3 times a day with a soft bristle tooth brush.  Brush in a circular motion.  Brush close to your gums as well as your teeth.  Brush your back teeth longer than your front teeth.  Floss your teeth once a day. Before bedtime is a good time to floss.  Go to a dentist 2 times a year.  Eat a balanced diet.  Eat good meals including fruits, vegetables, fish, chicken or other meat, and milk products.  Drink 8 to 10 glasses of water a day.  Do not eat candy or snack foods.  Do not  drink sugar-sweetened soft drinks or juice.  If a problem develops, go to your dentist right away.  Common problems are cavities, bleeding gums, sores or lumps in your mouth, or pain in your teeth, gums or jaws.  It is important to see a Dentist right away for these problems because you may lose your teeth or have a lot of pain if you do not go soon.  Take care of your child's teeth.  Brush the teeth with a soft bristle tooth brush.  Do not let your child eat or drink sweet foods and drinks.  Do not let your child fall asleep with a bottle in his or her mouth.  Take your child to a dentist when they are between 71 and 19 years old. This is the age you should start taking your child to a dentist. If your child has problems with their teeth, take them to a dentist sooner.  If a tooth is knocked out (yours or your child's):  Save the tooth if possible.  Wrap it in tissue or put it in a glass of milk.  Take it with you to your dentist right away. Document Released: 05/10/2008 Document Revised: 10/24/2011 Document Reviewed: 05/10/2008 [facility] Patient Information 2013 [place], [place].

## 2012-06-15 NOTE — Progress Notes (Signed)
  Subjective:    Patient ID: Casey Carrillo, female    DOB: 02-05-1956, 56 y.o.   MRN: [id number]  HPI Pt here c/o pain in tooth on low R side --she saw the dentist who did xrays and said there was no abscess and sent her to the endodontist.  She did not go because they wanted $95 up front.  She is still having pain.    Pt also c/o papules/ acne on cheeks and bridge of nose that comes and goes.  It is mostly gone now.     Review of Systems    as above Objective:   Physical Exam  Constitutional: She is oriented to person, place, and time. She appears well-developed and well-nourished.       Some errythema low R jaw.     HENT:  Right Ear: External ear normal.  Left Ear: External ear normal.  Nose: Nose normal.  Mouth/Throat: Oropharynx is clear and moist.  Neck: Normal range of motion. Neck supple.  Neurological: She is alert and oriented to person, place, and time.  Skin:       + papules cheek and across bridge of nose  Psychiatric: She has a normal mood and affect. Her behavior is normal. Judgment and thought content normal.          Assessment & Plan:  Gum disease----  abx and f/u dentist Rash---? Rosacea--metrolotion rto prn

## 2012-07-31 ENCOUNTER — Ambulatory Visit (INDEPENDENT_AMBULATORY_CARE_PROVIDER_SITE_OTHER): Payer: PRIVATE HEALTH INSURANCE

## 2012-07-31 DIAGNOSIS — Z23 Encounter for immunization: Secondary | ICD-10-CM

## 2013-06-20 ENCOUNTER — Other Ambulatory Visit: Payer: Self-pay

## 2016-01-12 ENCOUNTER — Telehealth: Payer: Self-pay | Admitting: *Deleted

## 2016-01-12 NOTE — Telephone Encounter (Signed)
Pt signed ROI received via fax. Forwarded to [place] to scan/email to medical records. JG//CMA

## 2018-01-05 ENCOUNTER — Encounter: Payer: Self-pay | Admitting: Gastroenterology
